# Patient Record
Sex: Female | Born: 1980 | Race: White | Hispanic: No | Marital: Single | Smoking: Current every day smoker
Health system: Southern US, Community
[De-identification: ages and names within clinical notes are randomized; demographics above are authoritative.]

## PROBLEM LIST (undated history)

## (undated) DIAGNOSIS — K649 Unspecified hemorrhoids: Secondary | ICD-10-CM

## (undated) DIAGNOSIS — N6019 Diffuse cystic mastopathy of unspecified breast: Secondary | ICD-10-CM

## (undated) HISTORY — PX: TUBAL LIGATION: SHX77

---

## 2011-09-18 HISTORY — PX: BREAST BIOPSY: SHX20

## 2013-08-27 ENCOUNTER — Emergency Department: Payer: Self-pay | Admitting: Internal Medicine

## 2014-03-02 ENCOUNTER — Emergency Department: Payer: Self-pay | Admitting: Emergency Medicine

## 2014-04-22 ENCOUNTER — Emergency Department: Payer: Self-pay | Admitting: Emergency Medicine

## 2014-05-06 ENCOUNTER — Emergency Department: Payer: Self-pay | Admitting: Emergency Medicine

## 2014-07-27 ENCOUNTER — Emergency Department: Payer: Self-pay | Admitting: Internal Medicine

## 2014-07-29 ENCOUNTER — Emergency Department: Payer: Self-pay | Admitting: Internal Medicine

## 2014-10-11 ENCOUNTER — Emergency Department: Payer: Self-pay | Admitting: Emergency Medicine

## 2015-07-15 ENCOUNTER — Encounter: Payer: Self-pay | Admitting: Emergency Medicine

## 2015-07-15 ENCOUNTER — Emergency Department: Admission: EM | Admit: 2015-07-15 | Discharge: 2015-07-15 | Discharge status: Absent without leave | Payer: Self-pay

## 2015-07-15 ENCOUNTER — Emergency Department
Admission: EM | Admit: 2015-07-15 | Discharge: 2015-07-15 | Disposition: A | Discharge status: Home / Self Care | Payer: Self-pay | Attending: Emergency Medicine | Admitting: Emergency Medicine

## 2015-07-15 DIAGNOSIS — N72 Inflammatory disease of cervix uteri: Secondary | ICD-10-CM

## 2015-07-15 LAB — URINALYSIS COMPLETE WITH MICROSCOPIC (ARMC ONLY)
BILIRUBIN URINE: NEGATIVE
Bacteria, UA: NONE SEEN
GLUCOSE, UA: NEGATIVE mg/dL
HGB URINE DIPSTICK: NEGATIVE
Ketones, ur: NEGATIVE mg/dL
NITRITE: NEGATIVE
Protein, ur: NEGATIVE mg/dL
RBC / HPF: NONE SEEN RBC/hpf (ref 0–5)
SPECIFIC GRAVITY, URINE: 1.008 (ref 1.005–1.030)
pH: 6 (ref 5.0–8.0)

## 2015-07-15 LAB — WET PREP, GENITAL
Trich, Wet Prep: NONE SEEN
Yeast Wet Prep HPF POC: NONE SEEN

## 2015-07-15 LAB — CHLAMYDIA/NGC RT PCR (ARMC ONLY)
CHLAMYDIA TR: DETECTED — AB
N GONORRHOEAE: DETECTED — AB

## 2015-07-15 MED ORDER — LIDOCAINE HCL (PF) 1 % IJ SOLN
2.1000 mL | Freq: Once | INTRAMUSCULAR | Status: AC
  Administered 2015-07-15: 2.1 mL
  Filled 2015-07-15: qty 5

## 2015-07-15 MED ORDER — AZITHROMYCIN 250 MG PO TABS
1000.0000 mg | ORAL_TABLET | Freq: Every day | ORAL | Status: DC
  Administered 2015-07-15: 1000 mg via ORAL
  Filled 2015-07-15: qty 4

## 2015-07-15 MED ORDER — CEFTRIAXONE SODIUM 1 G IJ SOLR
500.0000 mg | Freq: Once | INTRAMUSCULAR | Status: AC
  Administered 2015-07-15: 500 mg via INTRAMUSCULAR
  Filled 2015-07-15: qty 10

## 2015-07-15 MED ORDER — METRONIDAZOLE 500 MG PO TABS: 500.0000 mg | ORAL_TABLET | Freq: Two times a day (BID) | ORAL | Status: DC

## 2015-07-15 NOTE — ED Notes (Signed)
Pt states her boyfriend told her he tested positive for gonorrhea/clamydia.. Pt denies having any sx.

## 2015-07-15 NOTE — Discharge Instructions (Signed)
Cervicitis °Cervicitis is a soreness and swelling (inflammation) of the cervix. Your cervix is located at the bottom of your uterus. It opens up to the vagina. °CAUSES  °· Sexually transmitted infections (STIs).   °· Allergic reaction.   °· Medicines or birth control devices that are put in the vagina.   °· Injury to the cervix.   °· Bacterial infections.   °RISK FACTORS °You are at greater risk if you: °· Have unprotected sexual intercourse. °· Have sexual intercourse with many partners. °· Began sexual intercourse at an early age. °· Have a history of STIs. °SYMPTOMS  °There may be no symptoms. If symptoms occur, they may include:  °· Gray, white, yellow, or bad-smelling vaginal discharge.   °· Pain or itching of the area outside the vagina.   °· Painful sexual intercourse.   °· Lower abdominal or lower back pain, especially during intercourse.   °· Frequent urination.   °· Abnormal vaginal bleeding between periods, after sexual intercourse, or after menopause.   °· Pressure or a heavy feeling in the pelvis.   °DIAGNOSIS  °Diagnosis is made after a pelvic exam. Other tests may include:  °· Examination of any discharge under a microscope (wet prep).   °· A Pap test.   °TREATMENT  °Treatment will depend on the cause of cervicitis. If it is caused by an STI, both you and your partner will need to be treated. Antibiotic medicines will be given.  °HOME CARE INSTRUCTIONS  °· Do not have sexual intercourse until your health care provider says it is okay.   °· Do not have sexual intercourse until your partner has been treated, if your cervicitis is caused by an STI.   °· Take your antibiotics as directed. Finish them even if you start to feel better.   °SEEK MEDICAL CARE IF: °· Your symptoms come back.   °· You have a fever.   °MAKE SURE YOU:  °· Understand these instructions. °· Will watch your condition. °· Will get help right away if you are not doing well or get worse. °  °This information is not intended to replace  advice given to you by your health care provider. Make sure you discuss any questions you have with your health care provider. °  °Document Released: 09/03/2005 Document Revised: 09/08/2013 Document Reviewed: 02/25/2013 °Elsevier Interactive Patient Education ©2016 Elsevier Inc. ° °

## 2015-07-15 NOTE — ED Provider Notes (Signed)
[facility] Emergency Department Provider Note ____________________________________________  Time seen: Approximately 9:31 AM  I have reviewed the triage vital signs and the nursing notes.   HISTORY  Chief Complaint Exposure to STD   HPI Gabriella Lane is a 34 y.o. female who presents to the emergency department for evaluation of STD. She reports that her boyfriend advised her recently and he tested positive for chlamydia and gonorrhea. She denies symptoms with the exception of bleeding after intercourse.   History reviewed. No pertinent past medical history.  There are no active problems to display for this patient.   Past Surgical History  Procedure Laterality Date  . Tubal ligation      Current Outpatient Rx  Lane  Route  Sig  Dispense  Refill  . metroNIDAZOLE (FLAGYL) 500 MG tablet   Oral   Take 1 tablet (500 mg total) by mouth 2 (two) times daily.   14 tablet   0     Allergies Review of patient's allergies indicates no known allergies.  No family history on file.  Social History Social History  Substance Use Topics  . Smoking status: Never Smoker   . Smokeless tobacco: None  . Alcohol Use: No    Review of Systems Constitutional: No fever/chills Cardiovascular: Denies chest pain. Respiratory: Denies shortness of breath or cough. Gastrointestinal: Abdominal pain no., nausea no, vomitingno. Genitourinary: Dysuria no, vaginal discharge no.. Musculoskeletal: Negative for back pain. Skin: Negative for rash. Neurological: Negative for headaches, focal weakness or numbness.  10-point ROS otherwise negative.  ____________________________________________   PHYSICAL EXAM:  VITAL SIGNS: ED Triage Vitals  Enc Vitals Group     BP 07/15/15 0843 127/74 mmHg     Pulse Rate 07/15/15 0843 94     Resp 07/15/15 0843 16     Temp 07/15/15 0843 98.9 F (37.2 C)     Temp src --      SpO2 07/15/15 0843 97 %     Weight 07/15/15 0843 180 lb  (81.647 kg)     Height 07/15/15 0843  (1.575 m)     Head Cir --      Peak Flow --      Pain Score --      Pain Loc --      Pain Edu? --      Excl. in GC? --     Constitutional: Alert and oriented. Well appearing and in no acute distress. Eyes: Conjunctivae are normal. PERRL. EOMI. Head: Atraumatic. Nose: No congestion/rhinnorhea. Mouth/Throat: Mucous membranes are moist.  Oropharynx non-erythematous. Neck: No stridor. Cardiovascular: Good peripheral circulation. Respiratory: Normal respiratory effort.  No retractions. Gastrointestinal: Soft and nontender. No distention. No abdominal bruits. Genitourinary: Pelvic exam: External exam normal, yellow discharge noted on the vaginal walls and at the os of the cervix; mild CMT on bimanual exam Musculoskeletal: No extremity tenderness nor edema.  Neurologic:  Normal speech and language. No gross focal neurologic deficits are appreciated. Speech is normal. No gait instability. Skin:  Skin is warm, dry and intact. No rash noted. Psychiatric: Mood and affect are normal. Speech and behavior are normal.  ____________________________________________   LABS (all labs ordered are listed, but only abnormal results are displayed)  Labs Reviewed  WET PREP, GENITAL - Abnormal; Notable for the following:    Clue Cells Wet Prep HPF POC FEW (*)    WBC, Wet Prep HPF POC MODERATE (*)    All other components within normal limits  URINALYSIS COMPLETEWITH MICROSCOPIC [facility]  ONLY) - Abnormal; Notable for the following:    Color, Urine YELLOW (*)    APPearance CLOUDY (*)    Leukocytes, UA 3+ (*)    Squamous Epithelial / LPF 6-30 (*)    All other components within normal limits  CHLAMYDIA/NGC RT PCR ([facility] ONLY)   ____________________________________________  RADIOLOGY  Not indicated ____________________________________________   PROCEDURES  Procedure(s) performed: None  ____________________________________________   INITIAL  IMPRESSION / ASSESSMENT AND PLAN / ED COURSE  Pertinent labs & imaging results that were available during my care of the patient were reviewed by me and considered in my medical decision making (see chart for details).  Patient was advised to avoid intercourse until partner(s) are tested/treated. Information about the [facility] STD clinic was provided. She was advised to return to the Emergency Department for symptoms that change or worsen if unable to schedule an appointment.  ____________________________________________   FINAL CLINICAL IMPRESSION(S) / ED DIAGNOSES  Final diagnoses:  Cervicitis       Chinita PesterCari B Emiel Kielty, FNP 07/15/15 1044  Phineas SemenGraydon Goodman, MD 07/15/15 1147

## 2015-07-19 ENCOUNTER — Telehealth: Payer: Self-pay | Admitting: Emergency Medicine

## 2015-07-19 NOTE — ED Notes (Signed)
Called pt and informed her of positive gonorrhea and chlamydia tests. pateint was treated while in the ED.

## 2015-11-09 DIAGNOSIS — N644 Mastodynia: Secondary | ICD-10-CM | POA: Insufficient documentation

## 2015-11-26 ENCOUNTER — Emergency Department
Admission: EM | Admit: 2015-11-26 | Discharge: 2015-11-26 | Disposition: A | Discharge status: Home / Self Care | Payer: Self-pay | Attending: Emergency Medicine | Admitting: Emergency Medicine

## 2015-11-26 ENCOUNTER — Encounter: Payer: Self-pay | Admitting: Emergency Medicine

## 2015-11-26 DIAGNOSIS — F17210 Nicotine dependence, cigarettes, uncomplicated: Secondary | ICD-10-CM | POA: Insufficient documentation

## 2015-11-26 DIAGNOSIS — H6982 Other specified disorders of Eustachian tube, left ear: Secondary | ICD-10-CM | POA: Insufficient documentation

## 2015-11-26 MED ORDER — PREDNISONE 10 MG PO TABS: ORAL_TABLET | ORAL | Status: DC

## 2015-11-26 MED ORDER — FLUTICASONE PROPIONATE 50 MCG/ACT NA SUSP: 2.0000 | Freq: Every day | NASAL | Status: DC

## 2015-11-26 NOTE — ED Provider Notes (Signed)
[facility] Emergency Department Provider Note  ____________________________________________  Time seen: Approximately 6:49 PM  I have reviewed the triage vital signs and the nursing notes.   HISTORY  Chief Complaint Otalgia   HPI Gabriella Lane is a 35 y.o. female is here complaining of left ear pain for 3 days. Patient is unaware of any fever or chills. She states that she is not taking any over-the-counter medication for her ear. She states there is a lot of pressure and "feels like water in her ear". She rates her pain as a 7 out of 10.She also has had some mild nasal congestion and is not taking any over-the-counter medication for it as well.   History reviewed. No pertinent past medical history.  There are no active problems to display for this patient.   Past Surgical History  Procedure Laterality Date  . Tubal ligation      Current Outpatient Rx  Name  Route  Sig  Dispense  Refill  . fluticasone (FLONASE) 50 MCG/ACT nasal spray   Each Nare   Place 2 sprays into both nostrils daily.   16 g   0   . metroNIDAZOLE (FLAGYL) 500 MG tablet   Oral   Take 1 tablet (500 mg total) by mouth 2 (two) times daily.   14 tablet   0   . predniSONE (DELTASONE) 10 MG tablet      Take 3 tablets once a day for 3 days   9 tablet   0     Allergies Review of patient's allergies indicates no known allergies.  History reviewed. No pertinent family history.  Social History Social History  Substance Use Topics  . Smoking status: Current Every Day Smoker -- 1.00 packs/day    Types: Cigarettes  . Smokeless tobacco: Never Used  . Alcohol Use: No    Review of Systems Constitutional: No fever/chills ENT: No sore throat. Positive left ear pain. Cardiovascular: Denies chest pain. Respiratory: Denies shortness of breath. Genitourinary: Negative for dysuria. Musculoskeletal: Negative for back pain. Skin: Negative for rash. Neurological: Negative for  headaches, focal weakness or numbness.  10-point ROS otherwise negative.  ____________________________________________   PHYSICAL EXAM:  VITAL SIGNS: ED Triage Vitals  Enc Vitals Group     BP 11/26/15 1731 125/80 mmHg     Pulse Rate 11/26/15 1731 81     Resp 11/26/15 1731 18     Temp 11/26/15 1731 98.6 F (37 C)     Temp Source 11/26/15 1731 Oral     SpO2 11/26/15 1731 98 %     Weight 11/26/15 1731 160 lb (72.576 kg)     Height 11/26/15 1731  (1.575 m)     Head Cir --      Peak Flow --      Pain Score 11/26/15 1731 7     Pain Loc --      Pain Edu? --      Excl. in GC? --     Constitutional: Alert and oriented. Well appearing and in no acute distress. Eyes: Conjunctivae are normal. PERRL. EOMI. Head: Atraumatic. Nose: Mild congestion/rhinnorhea. EACs are clear bilaterally. The left TM is dull with poor light reflex but no injection was noted. There appears to be some mild fluid present. Right TM is dull without injection. Mouth/Throat: Mucous membranes are moist.  Oropharynx non-erythematous. Neck: No stridor.   Hematological/Lymphatic/Immunilogical: No cervical lymphadenopathy. Cardiovascular: [facility].  Good peripheral circulation. Respiratory:  Normal respiratory effort.  No retractions. Lungs CTAB. Musculoskeletal: Upper and lower extremities without any difficulty and normal gait was noted. Neurologic:  Normal speech and language. No gross focal neurologic deficits are appreciated. No gait instability. Skin:  Skin is warm, dry and intact. No rash noted. Psychiatric: Mood and affect are normal. Speech and behavior are normal.  ____________________________________________   LABS (all labs ordered are listed, but only abnormal results are displayed)  Labs Reviewed - No data to display  PROCEDURES  Procedure(s) performed: None  Critical Care performed:  No  ____________________________________________   INITIAL IMPRESSION / ASSESSMENT AND PLAN / ED COURSE  Pertinent labs & imaging results that were available during my care of the patient were reviewed by me and considered in my medical decision making (see chart for details).  She was started on Flonase nasal spray along with prednisone. She is to follow-up with [facility] if any continued problems. ____________________________________________   FINAL CLINICAL IMPRESSION(S) / ED DIAGNOSES  Final diagnoses:  Eustachian tube dysfunction, left      Tommi RumpsRhonda L Summers, PA-C 11/26/15 2258  Sharyn CreamerMark Quale, MD 11/26/15 2359

## 2015-11-26 NOTE — ED Notes (Signed)
Pt c/o L ear pain x 3 days. Denies other symptoms at this time.

## 2015-12-02 ENCOUNTER — Emergency Department
Admission: EM | Admit: 2015-12-02 | Discharge: 2015-12-02 | Disposition: A | Discharge status: Home / Self Care | Payer: Self-pay | Attending: Emergency Medicine | Admitting: Emergency Medicine

## 2015-12-02 ENCOUNTER — Encounter: Payer: Self-pay | Admitting: Emergency Medicine

## 2015-12-02 DIAGNOSIS — F17210 Nicotine dependence, cigarettes, uncomplicated: Secondary | ICD-10-CM | POA: Insufficient documentation

## 2015-12-02 DIAGNOSIS — B309 Viral conjunctivitis, unspecified: Secondary | ICD-10-CM | POA: Insufficient documentation

## 2015-12-02 MED ORDER — FLUORESCEIN SODIUM 1 MG OP STRP
1.0000 | ORAL_STRIP | Freq: Once | OPHTHALMIC | Status: DC
  Filled 2015-12-02: qty 1

## 2015-12-02 MED ORDER — KETOROLAC TROMETHAMINE 0.5 % OP SOLN: 1.0000 [drp] | Freq: Four times a day (QID) | OPHTHALMIC | Status: DC

## 2015-12-02 NOTE — ED Provider Notes (Signed)
[facility] Emergency Department Provider Note ____________________________________________  Time seen: 1935  I have reviewed the triage vital signs and the nursing notes.  HISTORY  Chief Complaint  Eye Problem  HPI Gabriella Lane is a 35 y.o. female density ED for right eyeredness and drainage the last 3 days. Patient describes onset Wednesday of right eye tearing with some mild purulent drainage. She darted on a friend's gentamicin ophthalmic ointment on the day of onset, but denies any significant improvement in her symptoms at this time. She describes on Wednesday she woke up and had this sensation that "something was in my eye". She denies interim nausea, vomiting, or dizziness. She does not wear glasses or contacts. She describes her overall discomfort 8/10 in triage.  History reviewed. No pertinent past medical history.  There are no active problems to display for this patient.  Past Surgical History  Procedure Laterality Date  . Tubal ligation      Current Outpatient Rx  Name  Route  Sig  Dispense  Refill  . fluticasone (FLONASE) 50 MCG/ACT nasal spray   Each Nare   Place 2 sprays into both nostrils daily.   16 g   0   . ketorolac (ACULAR) 0.5 % ophthalmic solution   Right Eye   Place 1 drop into the right eye 4 (four) times daily.   5 mL   0   . metroNIDAZOLE (FLAGYL) 500 MG tablet   Oral   Take 1 tablet (500 mg total) by mouth 2 (two) times daily.   14 tablet   0   . predniSONE (DELTASONE) 10 MG tablet      Take 3 tablets once a day for 3 days   9 tablet   0    Allergies Review of patient's allergies indicates no known allergies.  No family history on file.  Social History Social History  Substance Use Topics  . Smoking status: Current Every Day Smoker -- 1.00 packs/day    Types: Cigarettes  . Smokeless tobacco: Never Used  . Alcohol Use: No   Review of Systems  Constitutional: Negative for fever. Eyes: Negative for  visual changes.Right eye irritation and redness as above. ENT: Negative for sore throat. Respiratory: Negative for shortness of breath. Gastrointestinal: Negative for abdominal pain, vomiting and diarrhea. Musculoskeletal: Negative for back pain. Skin: Negative for rash. Neurological: Negative for headaches, focal weakness or numbness. ____________________________________________  PHYSICAL EXAM:  VITAL SIGNS: ED Triage Vitals  Enc Vitals Group     BP 12/02/15 1745 109/84 mmHg     Pulse Rate 12/02/15 1745 76     Resp 12/02/15 1745 16     Temp 12/02/15 1745 99.4 F (37.4 C)     Temp Source 12/02/15 1745 Oral     SpO2 12/02/15 1745 98 %     Weight 12/02/15 1745 170 lb (77.111 kg)     Height 12/02/15 1745  (1.575 m)     Head Cir --      Peak Flow --      Pain Score 12/02/15 1747 8     Pain Loc --      Pain Edu? --      Excl. in GC? --    Constitutional: Alert and oriented. Well appearing and in no distress. Head: Normocephalic and atraumatic.      Eyes: Conjunctivae are Injected on the right. Mild upper lid blepharitis on the right. PERRL. Normal extraocular movements. No fluorescein dye uptake on the right.  No purulent drainage is noted.      Ears: Canals clear. TMs intact bilaterally.   Nose: No congestion/rhinorrhea.   Mouth/Throat: Mucous membranes are moist.   Neck: Supple. No thyromegaly. Hematological/Lymphatic/Immunological: No cervical or preauricular lymphadenopathy. Musculoskeletal: Nontender with normal range of motion in all extremities.  Neurologic:  Normal gait without ataxia. Normal speech and language. No gross focal neurologic deficits are appreciated. Skin:  Skin is warm, dry and intact. No rash noted. ____________________________________________  INITIAL IMPRESSION / ASSESSMENT AND PLAN / ED COURSE  Patient was appears to be an acute viral conjunctivae is the right eye. She is discharged with a prescription for Acular for eye irritation. She  is encouraged to utilize over-the-counter rewetting drops artificial tears as needed. She is advised that the gentamicin would not have the benefit or an effect on this particular infection. She will follow-up with her primary care provider or [facility] as needed for symptom management. ____________________________________________  FINAL CLINICAL IMPRESSION(S) / ED DIAGNOSES  Final diagnoses:  Acute viral conjunctivitis of right eye      Lissa HoardJenise V Bacon Malaiyah Achorn, PA-C 12/03/15 0056  Myrna Blazeravid Matthew Schaevitz, MD 12/04/15 [phone]

## 2015-12-02 NOTE — Discharge Instructions (Signed)
Viral Conjunctivitis Viral conjunctivitis is an inflammation of the clear membrane that covers the white part of your eye and the inner surface of your eyelid (conjunctiva). The inflammation is caused by a viral infection. The blood vessels in the conjunctiva become inflamed, causing the eye to become red or pink, and often itchy. Viral conjunctivitis can easily be passed from one person to another (contagious). CAUSES  Viral conjunctivitis is caused by a virus. A virus is a type of contagious germ. It can be spread by touching objects that have been contaminated with the virus, such as doorknobs or towels.  SYMPTOMS  Symptoms of viral conjunctivitis may include:   Eye redness.  Tearing or watery eyes.  Itchy eyes.  Burning feeling in the eyes.  Clear drainage from the eye.  Swollen eyelids.  A gritty feeling in the eye.  Light sensitivity. DIAGNOSIS  Viral conjunctivitis may be diagnosed with a medical history and physical exam. If you have discharge from your eye, the discharge may be tested to rule out other causes of conjunctivitis.  TREATMENT  Viral conjunctivitis does not respond to medicines that kill bacteria (antibiotics). Treatment for viral conjunctivitis is directed at stopping a bacterial infection from developing in addition to the viral infection. Treatment also aims to relieve your symptoms, such as itching. This may be done with antihistamine drops or other eye medicines. HOME CARE INSTRUCTIONS  Take medicines only as directed by your health care provider.  Avoid touching or rubbing your eyes.  Apply a warm, clean washcloth to your eye for 10-20 minutes, 3-4 times per day.  If you wear contact lenses, do not wear them until the inflammation is gone and your health care provider says it is safe to wear them again. Ask your health care provider how to sterilize or replace your contact lenses before using them again. Wear glasses until you can resume wearing  contacts.  Avoid wearing eye makeup until the inflammation is gone. Throw away any old eye cosmetics that may be contaminated.  Change or wash your pillowcase every day.  Do not share towels or washcloths. This may spread the infection.  Wash your hands often with soap and water. Use paper towels to dry your hands.  Gently wipe away any drainage from your eye with a warm, wet washcloth or a cotton ball.  Be very careful to avoid touching the edge of the eyelid with the eye drop bottle or ointment tube when applying medicines to the affected eye. This will stop you from spreading the infection to the other eye or to other people. SEEK MEDICAL CARE IF:   Your symptoms do not improve with treatment.  You have increased pain.  Your vision becomes blurry.  You have a fever.  You have facial pain, redness, or swelling.  You have new symptoms.  Your symptoms get worse.   This information is not intended to replace advice given to you by your health care provider. Make sure you discuss any questions you have with your health care provider.   Document Released: 11/24/2002 Document Revised: 02/25/2006 Document Reviewed: 06/15/2014 Elsevier Interactive Patient Education 2016 [employer]  Use the eye drops as directed. Follow-up with Dr. Druscilla BrowniePorfilio as needed.

## 2015-12-02 NOTE — ED Notes (Signed)
Patient presents to the ED with right eye pain and redness since Wednesday.  Patient reports waking up and feeling like, "something was in my eye".  Patient reports eye discharge as well.  Patient is in no obvious distress at this time.

## 2016-08-09 ENCOUNTER — Emergency Department
Admission: EM | Admit: 2016-08-09 | Discharge: 2016-08-09 | Disposition: A | Discharge status: Home / Self Care | Payer: Self-pay | Attending: Emergency Medicine | Admitting: Emergency Medicine

## 2016-08-09 DIAGNOSIS — F17210 Nicotine dependence, cigarettes, uncomplicated: Secondary | ICD-10-CM | POA: Insufficient documentation

## 2016-08-09 DIAGNOSIS — J069 Acute upper respiratory infection, unspecified: Secondary | ICD-10-CM | POA: Insufficient documentation

## 2016-08-09 MED ORDER — BENZONATATE 100 MG PO CAPS: 100.0000 mg | ORAL_CAPSULE | Freq: Three times a day (TID) | ORAL | 0 refills | Status: DC | PRN

## 2016-08-09 NOTE — Discharge Instructions (Signed)
Your symptoms are likely due to a viral illness. You should continue to monitor and treat symptoms. Consider taking OTC Delsym cough syrup and allergy medicine + pseudoephedrine (Allegra-D/Claritin-D/Zyrtec-D) for symptom relief. [facility].

## 2016-08-09 NOTE — ED Triage Notes (Signed)
Pt came to ED via pov c/o sore throat and cough starting yesterday. Denies fever, chills.

## 2016-08-09 NOTE — ED Provider Notes (Signed)
[facility] Emergency Department Provider Note ____________________________________________  Time seen: [phone]  I have reviewed the triage vital signs and the nursing notes.  HISTORY  Chief Complaint  Sore Throat  HPI Gabriella Lane is a 35 y.o. female presents to the ED for evaluation of sore throat with onset about 8 hours prior. She denies fevers, but reports "hot & cold flashes." She also describes sore throat is fullness to the throat, but denies any difficulty swallowing, breathing, or controlling oral secretions. She is not taking any medications to alleviate her symptoms. In addition to the sore throat she reports some postnasal drainage, positive cough, and sinus pressure.  History reviewed. No pertinent past medical history.  There are no active problems to display for this patient.  Past Surgical History:  Procedure Laterality Date  . TUBAL LIGATION      Prior to Admission medications   Medication Sig Start Date End Date Taking? Authorizing Provider  benzonatate (TESSALON PERLES) 100 MG capsule Take 1 capsule (100 mg total) by mouth 3 (three) times daily as needed for cough (Take 1-2 per dose). 08/09/16   Chaston Bradburn V Bacon Darilyn Storbeck, PA-C  fluticasone (FLONASE) 50 MCG/ACT nasal spray Place 2 sprays into both nostrils daily. 11/26/15 11/25/16  Tommi Rumpshonda L Summers, PA-C  ketorolac (ACULAR) 0.5 % ophthalmic solution Place 1 drop into the right eye 4 (four) times daily. 12/02/15   Berlin Mokry V Bacon Orrin Yurkovich, PA-C  metroNIDAZOLE (FLAGYL) 500 MG tablet Take 1 tablet (500 mg total) by mouth 2 (two) times daily. 07/15/15   Chinita Pesterari B Triplett, FNP  predniSONE (DELTASONE) 10 MG tablet Take 3 tablets once a day for 3 days 11/26/15   Tommi Rumpshonda L Summers, PA-C    Allergies Patient has no known allergies.  No family history on file.  Social History Social History  Substance Use Topics  . Smoking status: Current Every Day Smoker    Packs/day: 1.00    Types: Cigarettes  .  Smokeless tobacco: Never Used  . Alcohol use No   Review of Systems  Constitutional: Negative for fever. Eyes: Negative for visual changes. ENT: Positive for sore throat. Cardiovascular: Negative for chest pain. Respiratory: Negative for shortness of breath. Reports productive cough. Gastrointestinal: Negative for abdominal pain, vomiting and diarrhea. Neurological: Negative for headaches, focal weakness or numbness. ____________________________________________  PHYSICAL EXAM:  VITAL SIGNS: ED Triage Vitals [08/09/16 0940]  Enc Vitals Group     BP 133/81     Pulse Rate 80     Resp 16     Temp 98.4 F (36.9 C)     Temp Source Oral     SpO2 100 %     Weight 170 lb (77.1 kg)     Height 5\' 2"  (1.575 m)     Head Circumference      Peak Flow      Pain Score      Pain Loc      Pain Edu?      Excl. in GC?    Constitutional: Alert and oriented. Well appearing and in no distress. Head: Normocephalic and atraumatic. Eyes: Conjunctivae are normal. PERRL. Normal extraocular movements Ears: Canals clear. TMs intact bilaterally. TMs are slightly retracted mildly erythematous and with early serous effusion appreciated. Nose: No congestion/rhinorrhea/epistaxis. Mouth/Throat: Mucous membranes are moist. Uvula is midline and tonsils are large but without edema, erythema, or exudate. Neck: Supple. No thyromegaly. Hematological/Lymphatic/Immunological: No cervical lymphadenopathy. Cardiovascular: Normal rate, regular rhythm. Normal distal pulses. Respiratory: Normal respiratory effort. No  wheezes/rales/rhonchi. Neurologic:  Normal gait without ataxia. Normal speech and language. No gross focal neurologic deficits are appreciated. Skin:  Skin is warm, dry and intact. No rash noted. Psychiatric: Mood and affect are normal. Patient exhibits appropriate insight and judgment. ____________________________________________   LABS (pertinent positives/negatives) Labs Reviewed  CULTURE, GROUP A  STREP [facility]  Rapid Strep - NEGATIVE ____________________________________________  INITIAL IMPRESSION / ASSESSMENT AND PLAN / ED COURSE  Patient with symptoms of the sore throat likely viral in nature. She will be discharged with prescription [facility]. She is encouraged to dose over-the-counter Delsym cough syrup as well as a combination allergy medicine/decongestant. [facility].  Clinical Course    ____________________________________________  FINAL CLINICAL IMPRESSION(S) / ED DIAGNOSES  Final diagnoses:  Viral upper respiratory tract infection      Lissa HoardJenise V Bacon Kyndall Chaplin, PA-C 08/09/16 1020    Nita Sicklearolina Veronese, MD 08/12/16 2326

## 2016-08-11 LAB — CULTURE, GROUP A STREP (THRC)

## 2016-08-12 ENCOUNTER — Encounter: Payer: Self-pay | Admitting: Medical Oncology

## 2016-08-12 ENCOUNTER — Emergency Department
Admission: EM | Admit: 2016-08-12 | Discharge: 2016-08-12 | Disposition: A | Discharge status: Home / Self Care | Payer: Self-pay | Attending: Student in an Organized Health Care Education/Training Program | Admitting: Student in an Organized Health Care Education/Training Program

## 2016-08-12 ENCOUNTER — Emergency Department: Payer: Self-pay

## 2016-08-12 DIAGNOSIS — F17200 Nicotine dependence, unspecified, uncomplicated: Secondary | ICD-10-CM

## 2016-08-12 DIAGNOSIS — J209 Acute bronchitis, unspecified: Secondary | ICD-10-CM | POA: Insufficient documentation

## 2016-08-12 DIAGNOSIS — F17210 Nicotine dependence, cigarettes, uncomplicated: Secondary | ICD-10-CM | POA: Insufficient documentation

## 2016-08-12 LAB — CBC WITH DIFFERENTIAL/PLATELET
BASOS PCT: 1 %
Basophils Absolute: 0.1 10*3/uL (ref 0–0.1)
Eosinophils Absolute: 0.3 10*3/uL (ref 0–0.7)
Eosinophils Relative: 2 %
HEMATOCRIT: 36.7 % (ref 35.0–47.0)
HEMOGLOBIN: 12.6 g/dL (ref 12.0–16.0)
LYMPHS PCT: 12 %
Lymphs Abs: 2.1 10*3/uL (ref 1.0–3.6)
MCH: 27.3 pg (ref 26.0–34.0)
MCHC: 34.2 g/dL (ref 32.0–36.0)
MCV: 79.8 fL — AB (ref 80.0–100.0)
MONO ABS: 0.9 10*3/uL (ref 0.2–0.9)
MONOS PCT: 5 %
NEUTROS ABS: 13.7 10*3/uL — AB (ref 1.4–6.5)
NEUTROS PCT: 80 %
Platelets: 227 10*3/uL (ref 150–440)
RBC: 4.6 MIL/uL (ref 3.80–5.20)
RDW: 13.9 % (ref 11.5–14.5)
WBC: 17.1 10*3/uL — ABNORMAL HIGH (ref 3.6–11.0)

## 2016-08-12 LAB — URINALYSIS COMPLETE WITH MICROSCOPIC (ARMC ONLY)
BILIRUBIN URINE: NEGATIVE
GLUCOSE, UA: NEGATIVE mg/dL
HGB URINE DIPSTICK: NEGATIVE
KETONES UR: NEGATIVE mg/dL
NITRITE: NEGATIVE
Protein, ur: NEGATIVE mg/dL
SPECIFIC GRAVITY, URINE: 1.018 (ref 1.005–1.030)
pH: 5 (ref 5.0–8.0)

## 2016-08-12 LAB — INFLUENZA PANEL BY PCR (TYPE A & B)
INFLAPCR: NEGATIVE
Influenza B By PCR: NEGATIVE

## 2016-08-12 LAB — POCT PREGNANCY, URINE: Preg Test, Ur: NEGATIVE

## 2016-08-12 MED ORDER — ALBUTEROL SULFATE HFA 108 (90 BASE) MCG/ACT IN AERS: 2.0000 | INHALATION_SPRAY | Freq: Four times a day (QID) | RESPIRATORY_TRACT | 2 refills | Status: DC | PRN

## 2016-08-12 MED ORDER — GUAIFENESIN-CODEINE 100-10 MG/5ML PO SOLN: 5.0000 mL | ORAL | 0 refills | Status: DC | PRN

## 2016-08-12 MED ORDER — SULFAMETHOXAZOLE-TRIMETHOPRIM 800-160 MG PO TABS: 1.0000 | ORAL_TABLET | Freq: Two times a day (BID) | ORAL | 0 refills | Status: DC

## 2016-08-12 NOTE — Discharge Instructions (Signed)
Follow-up with your primary care doctor if any continued problems or [facility]. Discontinue smoking. Pro-air albuterol inhaler as directed. Bactrim DS twice a day for infection. Increase fluids. Tylenol or ibuprofen as needed for muscle aches, fever or headache.

## 2016-08-12 NOTE — ED Provider Notes (Signed)
[facility] Emergency Department Provider Note   ____________________________________________   First MD Initiated Contact with Patient 08/12/16 1035     (approximate)  I have reviewed the triage vital signs and the nursing notes.   HISTORY  Chief Complaint URI; Chills; and Generalized Body Aches    HPI Gabriella Lane is a 35 y.o. female is here with complaint of cold symptoms and continues to have body aches which has worsened. Patient states she's also had intermittent fever and chills. She has not had any relief of her cough and states that mostly her cough is nonproductive. Patient was seen in the emergency room on 08/09/16 which time she was diagnosed with upper respiratory infection. Patient was given a prescription for [facility] which she states has not helped with her cough. She states that she has been "exposed to the flu". She states she is feeling worse than she did 3 days ago.   History reviewed. No pertinent past medical history.  There are no active problems to display for this patient.   Past Surgical History:  Procedure Laterality Date  . TUBAL LIGATION      Prior to Admission medications   Medication Sig Start Date End Date Taking? Authorizing Provider  albuterol (PROVENTIL HFA;VENTOLIN HFA) 108 (90 Base) MCG/ACT inhaler Inhale 2 puffs into the lungs every 6 (six) hours as needed for wheezing or shortness of breath. 08/12/16   Tommi Rumpshonda L Summers, PA-C  benzonatate (TESSALON PERLES) 100 MG capsule Take 1 capsule (100 mg total) by mouth 3 (three) times daily as needed for cough (Take 1-2 per dose). 08/09/16   Jenise V Bacon Menshew, PA-C  fluticasone (FLONASE) 50 MCG/ACT nasal spray Place 2 sprays into both nostrils daily. 11/26/15 11/25/16  Tommi Rumpshonda L Summers, PA-C  guaiFENesin-codeine 100-10 MG/5ML syrup Take 5 mLs by mouth every 4 (four) hours as needed. 08/12/16   Tommi Rumpshonda L Summers, PA-C  sulfamethoxazole-trimethoprim (BACTRIM  DS,SEPTRA DS) 800-160 MG tablet Take 1 tablet by mouth 2 (two) times daily. 08/12/16   Tommi Rumpshonda L Summers, PA-C    Allergies Patient has no known allergies.  No family history on file.  Social History Social History  Substance Use Topics  . Smoking status: Current Every Day Smoker    Packs/day: 1.00    Types: Cigarettes  . Smokeless tobacco: Never Used  . Alcohol use No    Review of Systems Constitutional: No fever/chills Eyes: No visual changes. ENT: Positive sore throat. Cardiovascular: Denies chest pain. Respiratory: Denies shortness of breath. Positive for cough. Gastrointestinal: No abdominal pain.  No nausea, no vomiting.  No diarrhea.  No constipation. Genitourinary: Negative for dysuria. Musculoskeletal: Negative for back pain. Positive for generalized body aches. Skin: Negative for rash. Neurological: Negative for headaches, focal weakness or numbness.  10-point ROS otherwise negative.  ____________________________________________   PHYSICAL EXAM:  VITAL SIGNS: ED Triage Vitals [08/12/16 1016]  Enc Vitals Group     BP 122/72     Pulse Rate 100     Resp 19     Temp 98.8 F (37.1 C)     Temp Source Oral     SpO2 97 %     Weight 170 lb (77.1 kg)     Height 5\' 3"  (1.6 m)     Head Circumference      Peak Flow      Pain Score 7     Pain Loc      Pain Edu?      Excl.  in GC?     Constitutional: Alert and oriented. Well appearing and in no acute distress. Eyes: Conjunctivae are normal. PERRL. EOMI. Head: Atraumatic. Nose: Mild congestion/no rhinnorhea.  EACs are clear bilaterally. TMs are dull, no erythema or injection was seen. Mouth/Throat: Mucous membranes are moist.  Oropharynx non-erythematous. Mild posterior drainage. Neck: No stridor.   Hematological/Lymphatic/Immunilogical: No cervical lymphadenopathy. Cardiovascular: Normal rate, regular rhythm. Grossly normal heart sounds.  Good peripheral circulation. Respiratory: Normal respiratory effort.   No retractions. Lungs CTAB. Gastrointestinal: Soft and nontender. No distention.  Musculoskeletal: No lower extremity tenderness nor edema.  No joint effusions. Neurologic:  Normal speech and language. No gross focal neurologic deficits are appreciated. No gait instability. Skin:  Skin is warm, dry and intact. No rash noted. Psychiatric: Mood and affect are normal. Speech and behavior are normal.  ____________________________________________   LABS (all labs ordered are listed, but only abnormal results are displayed)  Labs Reviewed  URINALYSIS COMPLETEWITH MICROSCOPIC ([facility] ONLY) - Abnormal; Notable for the following:       Result Value   Color, Urine YELLOW (*)    APPearance CLEAR (*)    Leukocytes, UA TRACE (*)    Bacteria, UA RARE (*)    Squamous Epithelial / LPF 0-5 (*)    All other components within normal limits  CBC WITH DIFFERENTIAL/PLATELET - Abnormal; Notable for the following:    WBC 17.1 (*)    MCV 79.8 (*)    Neutro Abs 13.7 (*)    All other components within normal limits  INFLUENZA PANEL BY PCR (TYPE A & B, H1N1)  POC URINE PREG, ED  POCT PREGNANCY, URINE    RADIOLOGY Chest x-ray per radiologist: IMPRESSION:  No infiltrate or pulmonary edema. Perihilar infrahilar bronchitic  changes.   I, Tommi Rumpshonda L Summers, personally viewed and evaluated these images (plain radiographs) as part of my medical decision making, as well as reviewing the written report by the radiologist.  ____________________________________________   PROCEDURES  Procedure(s) performed: None  Procedures  Critical Care performed: No  ____________________________________________   INITIAL IMPRESSION / ASSESSMENT AND PLAN / ED COURSE  Pertinent labs & imaging results that were available during my care of the patient were reviewed by me and considered in my medical decision making (see chart for details).    Clinical Course    Patient was given medication for treatment of  bronchitis. Patient was given a prescription for Bactrim DS twice a day for 10 days along with albuterol inhaler. She is given a prescription for Robitussin-AC as needed for cough. She is aware that she cannot drive or operate machinery while taking this medication. She will follow-up with her primary care doctor or [facility] if any continued problems.  ____________________________________________   FINAL CLINICAL IMPRESSION(S) / ED DIAGNOSES  Final diagnoses:  Acute bronchitis, unspecified organism  Current every day smoker      NEW MEDICATIONS STARTED DURING THIS VISIT:  Discharge Medication List as of 08/12/2016 12:35 PM    START taking these medications   Details  albuterol (PROVENTIL HFA;VENTOLIN HFA) 108 (90 Base) MCG/ACT inhaler Inhale 2 puffs into the lungs every 6 (six) hours as needed for wheezing or shortness of breath., Starting Sun 08/12/2016, Print    sulfamethoxazole-trimethoprim (BACTRIM DS,SEPTRA DS) 800-160 MG tablet Take 1 tablet by mouth 2 (two) times daily., Starting Sun 08/12/2016, Print         Note:  This document was prepared using Dragon voice recognition software and may include unintentional dictation errors.  Tommi Rumps, PA-C 08/12/16 1847    Willy Eddy, MD 08/13/16 [phone]

## 2016-08-12 NOTE — ED Triage Notes (Signed)
Pt began having cold sx's Thursday, yesterday began having body aches.

## 2016-08-12 NOTE — ED Notes (Signed)
See triage note  States she was seen on Thursday   And dx'd with URI    conts to have body aches with intermittent fever   States no relief from cough   States cough is mainly dry but occasionally prod

## 2017-06-25 ENCOUNTER — Emergency Department
Admission: EM | Admit: 2017-06-25 | Discharge: 2017-06-25 | Disposition: A | Discharge status: Home / Self Care | Payer: Self-pay | Attending: Emergency Medicine | Admitting: Emergency Medicine

## 2017-06-25 ENCOUNTER — Encounter: Payer: Self-pay | Admitting: Medical Oncology

## 2017-06-25 DIAGNOSIS — F17210 Nicotine dependence, cigarettes, uncomplicated: Secondary | ICD-10-CM | POA: Insufficient documentation

## 2017-06-25 DIAGNOSIS — H6981 Other specified disorders of Eustachian tube, right ear: Secondary | ICD-10-CM | POA: Insufficient documentation

## 2017-06-25 MED ORDER — FLUTICASONE PROPIONATE 50 MCG/ACT NA SUSP: 2.0000 | Freq: Every day | NASAL | 0 refills | Status: DC

## 2017-06-25 MED ORDER — PREDNISONE 10 MG PO TABS: ORAL_TABLET | ORAL | 0 refills | Status: DC

## 2017-06-25 NOTE — ED Provider Notes (Signed)
[facility] Emergency Department Provider Note   ____________________________________________   First MD Initiated Contact with Patient 06/25/17 0732     (approximate)  I have reviewed the triage vital signs and the nursing notes.   HISTORY  Chief Complaint Otalgia    HPI Gabriella Lane is a 36 y.o. female is here complaining of right ear pain for approximate 5 days. Patient is unaware of any fever and denies chills.Patient states that the outside of her ear has felt warm at times. She is tried over-the-counter medications without any relief. She also complains of decreased hearing in her right ear with some dizziness. Currently she rates her pain as 5/10.  History reviewed. No pertinent past medical history.  There are no active problems to display for this patient.   Past Surgical History:  Procedure Laterality Date  . TUBAL LIGATION      Prior to Admission medications   Medication Sig Start Date End Date Taking? Authorizing Provider  albuterol (PROVENTIL HFA;VENTOLIN HFA) 108 (90 Base) MCG/ACT inhaler Inhale 2 puffs into the lungs every 6 (six) hours as needed for wheezing or shortness of breath. 08/12/16   Tommi Rumps, PA-C  fluticasone (FLONASE) 50 MCG/ACT nasal spray Place 2 sprays into both nostrils daily. 06/25/17 06/25/18  Tommi Rumps, PA-C  predniSONE (DELTASONE) 10 MG tablet Take 3 tablets once a day for the next 4 days. 06/25/17   Tommi Rumps, PA-C    Allergies Patient has no known allergies.  No family history on file.  Social History Social History  Substance Use Topics  . Smoking status: Current Every Day Smoker    Packs/day: 1.00    Types: Cigarettes  . Smokeless tobacco: Never Used  . Alcohol use No    Review of Systems Constitutional: No fever/chills Eyes: No visual changes. ENT: No sore throat.  Positive right ear pain. Cardiovascular: Denies chest pain. Respiratory: Denies shortness of  breath. Gastrointestinal:   No nausea, no vomiting.  Musculoskeletal: Negative for back pain. Skin: Negative for rash. Neurological: Negative for headaches, focal weakness or numbness. ___________________________________________   PHYSICAL EXAM:  VITAL SIGNS: ED Triage Vitals [06/25/17 0716]  Enc Vitals Group     BP 136/82     Pulse Rate (!) 108     Resp 16     Temp 98.4 F (36.9 C)     Temp Source Oral     SpO2 98 %     Weight 170 lb (77.1 kg)     Height      Head Circumference      Peak Flow      Pain Score 5     Pain Loc      Pain Edu?      Excl. in GC?    Constitutional: Alert and oriented. Well appearing and in no acute distress. Eyes: Conjunctivae are normal.  Head: Atraumatic. Nose: Minimal congestion/rhinnorhea.  EACs bilaterally are clear. Right TM is dull with poor light reflex but no injection or erythema. Mouth/Throat: Mucous membranes are moist.  Oropharynx non-erythematous. Moderate posterior drainage. Neck: No stridor.   Hematological/Lymphatic/Immunilogical: No cervical lymphadenopathy. Cardiovascular: Normal rate, regular rhythm. Grossly normal heart sounds.  Good peripheral circulation. Respiratory: Normal respiratory effort.  No retractions. Lungs CTAB. Musculoskeletal: No lower extremity tenderness nor edema.  No joint effusions. Neurologic:  Normal speech and language. No gross focal neurologic deficits are appreciated. No gait instability. Skin:  Skin is warm, dry and intact. No rash noted. Psychiatric:  Mood and affect are normal. Speech and behavior are normal.  ____________________________________________   LABS (all labs ordered are listed, but only abnormal results are displayed)  Labs Reviewed - No data to display  PROCEDURES  Procedure(s) performed: None  Procedures  Critical Care performed: No  ____________________________________________   INITIAL IMPRESSION / ASSESSMENT AND PLAN / ED COURSE  Patient was placed on Flonase  nasal spray 2 sprays each nostril daily and prednisone 30 mg daily for the next 4 days. She is to follow-up with [facility] if any continued problems. Patient was reassured that there is no signs of infection at this time.   ___________________________________________   FINAL CLINICAL IMPRESSION(S) / ED DIAGNOSES  Final diagnoses:  Acute dysfunction of right eustachian tube      NEW MEDICATIONS STARTED DURING THIS VISIT:  Discharge Medication List as of 06/25/2017  8:05 AM    START taking these medications   Details  predniSONE (DELTASONE) 10 MG tablet Take 3 tablets once a day for the next 4 days., Print         Note:  This document was prepared using Dragon voice recognition software and may include unintentional dictation errors.    Tommi Rumps, PA-C 06/25/17 1350    Merrily Brittle, MD 06/25/17 2002

## 2017-06-25 NOTE — ED Notes (Signed)
Says right ear pain since Thursday.  Started bactrim twice a day since fri am.  Also trying swimmers ear drops otc.  Says that hurt.  Also put sweet oil in it.  Still painful.

## 2017-06-25 NOTE — Discharge Instructions (Signed)
Follow-up with [facility] clinic if any continued problems. Begin taking medication as directed. Flonase 2 sprays each nostril once a day and prednisone 3 tablets once a day for the next 4 days. Increase fluids.

## 2017-06-25 NOTE — ED Triage Notes (Signed)
Pt reports rt ear pain since Thursday.

## 2017-07-21 ENCOUNTER — Emergency Department
Admission: EM | Admit: 2017-07-21 | Discharge: 2017-07-21 | Disposition: A | Discharge status: Home / Self Care | Payer: Self-pay | Attending: Emergency Medicine | Admitting: Emergency Medicine

## 2017-07-21 ENCOUNTER — Encounter: Payer: Self-pay | Admitting: Emergency Medicine

## 2017-07-21 DIAGNOSIS — J020 Streptococcal pharyngitis: Secondary | ICD-10-CM | POA: Insufficient documentation

## 2017-07-21 DIAGNOSIS — Z79899 Other long term (current) drug therapy: Secondary | ICD-10-CM | POA: Insufficient documentation

## 2017-07-21 DIAGNOSIS — F17210 Nicotine dependence, cigarettes, uncomplicated: Secondary | ICD-10-CM | POA: Insufficient documentation

## 2017-07-21 LAB — POCT RAPID STREP A: Streptococcus, Group A Screen (Direct): NEGATIVE

## 2017-07-21 MED ORDER — AMOXICILLIN 875 MG PO TABS: 875.0000 mg | ORAL_TABLET | Freq: Two times a day (BID) | ORAL | 0 refills | Status: AC

## 2017-07-21 MED ORDER — AMOXICILLIN 500 MG PO CAPS
500.0000 mg | ORAL_CAPSULE | Freq: Once | ORAL | Status: AC
  Administered 2017-07-21: 500 mg via ORAL
  Filled 2017-07-21: qty 1

## 2017-07-21 MED ORDER — DEXAMETHASONE SODIUM PHOSPHATE 10 MG/ML IJ SOLN
10.0000 mg | Freq: Once | INTRAMUSCULAR | Status: AC
  Administered 2017-07-21: 10 mg via INTRAMUSCULAR
  Filled 2017-07-21: qty 1

## 2017-07-21 NOTE — ED Triage Notes (Signed)
Pt comes into the ED via POV c/o sore throat.  Patient has significant h/o strep throat and is positive for white patches on the back of throat.  Patient denies any other symptoms other than chills.  Patient in NAD at this time with even and unlabored respirations.

## 2017-07-21 NOTE — ED Provider Notes (Signed)
[facility] Emergency Department Provider Note  ____________________________________________  Time seen: Approximately 11:00 PM  I have reviewed the triage vital signs and the nursing notes.   HISTORY  Chief Complaint Sore Throat    HPI Gabriella Lane is a 36 y.o. female presents to the emergency department with pharyngitis, headache and abdominal discomfort for the past 2 days.  Patient also reports seeing white patches on the back of her throat and has had anterior neck pain.  She has had chills but has not evaluated her temperature at home.  Patient reports that she experiences multiple episodes of strep pharyngitis per year.  She denies chest pain, chest tightness, shortness of breath, nausea, vomiting abdominal pain.  She is tolerating fluids by mouth and her own secretions.  History reviewed. No pertinent past medical history.  There are no active problems to display for this patient.   Past Surgical History:  Procedure Laterality Date  . TUBAL LIGATION      Prior to Admission medications   Medication Sig Start Date End Date Taking? Authorizing Provider  albuterol (PROVENTIL HFA;VENTOLIN HFA) 108 (90 Base) MCG/ACT inhaler Inhale 2 puffs into the lungs every 6 (six) hours as needed for wheezing or shortness of breath. 08/12/16   Tommi RumpsSummers, Rhonda L, PA-C  amoxicillin (AMOXIL) 875 MG tablet Take 1 tablet (875 mg total) 2 (two) times daily for 10 days by mouth. 07/21/17 07/31/17  Orvil FeilWoods, [place] Brent M, PA-C  fluticasone (FLONASE) 50 MCG/ACT nasal spray Place 2 sprays into both nostrils daily. 06/25/17 06/25/18  Tommi RumpsSummers, Rhonda L, PA-C  predniSONE (DELTASONE) 10 MG tablet Take 3 tablets once a day for the next 4 days. 06/25/17   Tommi RumpsSummers, Rhonda L, PA-C    Allergies Patient has no known allergies.  No family history on file.  Social History Social History   Tobacco Use  . Smoking status: Current Every Day Smoker    Packs/day: 1.00    Types: Cigarettes  .  Smokeless tobacco: Never Used  Substance Use Topics  . Alcohol use: No  . Drug use: No     Review of Systems  Constitutional: Patient has chills Eyes: No visual changes. No discharge ENT: Patient has pharyngitis. Cardiovascular: no chest pain. Respiratory: no cough. No SOB. Gastrointestinal: Patient has abdominal discomfort.  No nausea, no vomiting.  No diarrhea.  No constipation. Musculoskeletal: Negative for musculoskeletal pain. Skin: Negative for rash, abrasions, lacerations, ecchymosis. Neurological: Patient has had headache, no focal weakness or numbness.   ____________________________________________   PHYSICAL EXAM:  VITAL SIGNS: ED Triage Vitals [07/21/17 2148]  Enc Vitals Group     BP 128/89     Pulse Rate (!) 101     Resp 17     Temp 99.1 F (37.3 C)     Temp Source Oral     SpO2 100 %     Weight 180 lb (81.6 kg)     Height 5\' 2"  (1.575 m)     Head Circumference      Peak Flow      Pain Score 6     Pain Loc      Pain Edu?      Excl. in GC?      Constitutional: Alert and oriented. Well appearing and in no acute distress. Eyes: Conjunctivae are normal. PERRL. EOMI. Head: Atraumatic. ENT:      Ears: TMs are pearly bilaterally.      Nose: No congestion/rhinnorhea.      Mouth/Throat: Mucous membranes are  moist.  Posterior pharynx is erythematous with bilateral tonsillar hypertrophy and exudate.  Uvula is midline. Hematological/Lymphatic/Immunilogical: Palpable cervical lymphadenopathy. Cardiovascular: Normal rate, regular rhythm. Normal S1 and S2.  Good peripheral circulation. Respiratory: Normal respiratory effort without tachypnea or retractions. Lungs CTAB. Good air entry to the bases with no decreased or absent breath sounds. Skin:  Skin is warm, dry and intact. No rash noted. Psychiatric: Mood and affect are normal. Speech and behavior are normal. Patient exhibits appropriate insight and judgement.   ____________________________________________    LABS (all labs ordered are listed, but only abnormal results are displayed)  Labs Reviewed  POCT RAPID STREP A   ____________________________________________  EKG   ____________________________________________  RADIOLOGY   No results found.  ____________________________________________    PROCEDURES  Procedure(s) performed:    Procedures    Medications  dexamethasone (DECADRON) injection 10 mg (not administered)  amoxicillin (AMOXIL) capsule 500 mg (not administered)     ____________________________________________   INITIAL IMPRESSION / ASSESSMENT AND PLAN / ED COURSE  Pertinent labs & imaging results that were available during my care of the patient were reviewed by me and considered in my medical decision making (see chart for details).  Review of the [place] CSRS was performed in accordance of the [facility] prior to dispensing any controlled drugs.     Assessment and plan Strep pharyngitis Patient presents to the emergency department with pharyngitis, headache, abdominal discomfort, chills and anterior neck discomfort for the past 2 days.  History and physical exam findings are consistent with strep pharyngitis.  Patient was given amoxicillin in the emergency department.  She was discharged with amoxicillin.  Vital signs were reassuring prior to discharge.  All patient questions were answered.  ____________________________________________  FINAL CLINICAL IMPRESSION(S) / ED DIAGNOSES  Final diagnoses:  Strep pharyngitis      NEW MEDICATIONS STARTED DURING THIS VISIT:  This SmartLink is deprecated. Use AVSMEDLIST instead to display the medication list for a patient.      This chart was dictated using voice recognition software/Dragon. Despite best efforts to proofread, errors can occur which can change the meaning. Any change was purely unintentional.    Orvil Feil, PA-C 07/21/17 2304    Jeanmarie Plant, MD 07/25/17 [phone]

## 2017-07-22 ENCOUNTER — Other Ambulatory Visit: Payer: Self-pay | Admitting: Emergency Medicine

## 2017-08-08 ENCOUNTER — Encounter: Payer: Self-pay | Admitting: Emergency Medicine

## 2017-08-08 ENCOUNTER — Emergency Department
Admission: EM | Admit: 2017-08-08 | Discharge: 2017-08-08 | Disposition: A | Discharge status: Home / Self Care | Payer: Medicaid Other | Attending: Emergency Medicine | Admitting: Emergency Medicine

## 2017-08-08 ENCOUNTER — Other Ambulatory Visit: Payer: Self-pay

## 2017-08-08 DIAGNOSIS — F17210 Nicotine dependence, cigarettes, uncomplicated: Secondary | ICD-10-CM | POA: Insufficient documentation

## 2017-08-08 DIAGNOSIS — J020 Streptococcal pharyngitis: Secondary | ICD-10-CM

## 2017-08-08 DIAGNOSIS — Z79899 Other long term (current) drug therapy: Secondary | ICD-10-CM | POA: Insufficient documentation

## 2017-08-08 LAB — POCT RAPID STREP A: Streptococcus, Group A Screen (Direct): POSITIVE — AB

## 2017-08-08 MED ORDER — PENICILLIN G BENZATHINE 1200000 UNIT/2ML IM SUSP
1.2000 10*6.[IU] | Freq: Once | INTRAMUSCULAR | Status: AC
  Administered 2017-08-08: 1.2 10*6.[IU] via INTRAMUSCULAR
  Filled 2017-08-08: qty 2

## 2017-08-08 MED ORDER — TRAMADOL HCL 50 MG PO TABS
50.0000 mg | ORAL_TABLET | Freq: Once | ORAL | Status: AC
  Administered 2017-08-08: 50 mg via ORAL
  Filled 2017-08-08: qty 1

## 2017-08-08 MED ORDER — DEXAMETHASONE SODIUM PHOSPHATE 10 MG/ML IJ SOLN
10.0000 mg | Freq: Once | INTRAMUSCULAR | Status: AC
  Administered 2017-08-08: 10 mg via INTRAMUSCULAR
  Filled 2017-08-08: qty 1

## 2017-08-08 MED ORDER — MAGIC MOUTHWASH W/LIDOCAINE
5.0000 mL | Freq: Four times a day (QID) | ORAL | 0 refills | Status: DC
Start: 2017-08-08 — End: 2017-11-29

## 2017-08-08 NOTE — ED Provider Notes (Signed)
[facility] Emergency Department Provider Note  ____________________________________________  Time seen: Approximately 3:18 PM  I have reviewed the triage vital signs and the nursing notes.   HISTORY  Chief Complaint Sore Throat    HPI Gabriella Lane is a 36 y.o. female who presents emergency department complaining of fevers and chills, sore throat, body aches times 2 days.  Patient reports that she has been exposed to influenza but is also has a history of repeated strep infections.  Patient reports that she has been taking Tylenol at home.  Patient denies any headache, visual changes, neck pain or stiffness, chest pain, cough, shortness of breath, abdominal pain, nausea or vomiting, diarrhea or constipation.  History reviewed. No pertinent past medical history.  There are no active problems to display for this patient.   Past Surgical History:  Procedure Laterality Date  . TUBAL LIGATION      Prior to Admission medications   Medication Sig Start Date End Date Taking? Authorizing Provider  albuterol (PROVENTIL HFA;VENTOLIN HFA) 108 (90 Base) MCG/ACT inhaler Inhale 2 puffs into the lungs every 6 (six) hours as needed for wheezing or shortness of breath. 08/12/16   Tommi RumpsSummers, Rhonda L, PA-C  fluticasone (FLONASE) 50 MCG/ACT nasal spray Place 2 sprays into both nostrils daily. 06/25/17 06/25/18  Tommi RumpsSummers, Rhonda L, PA-C  predniSONE (DELTASONE) 10 MG tablet Take 3 tablets once a day for the next 4 days. 06/25/17   Tommi RumpsSummers, Rhonda L, PA-C    Allergies Patient has no known allergies.  No family history on file.  Social History Social History   Tobacco Use  . Smoking status: Current Every Day Smoker    Packs/day: 1.00    Types: Cigarettes  . Smokeless tobacco: Never Used  Substance Use Topics  . Alcohol use: No  . Drug use: No     Review of Systems  Constitutional: No fever/chills Eyes: No visual changes. No discharge ENT: Positive for sore  throat Cardiovascular: no chest pain. Respiratory: no cough. No SOB. Gastrointestinal: No abdominal pain.  No nausea, no vomiting.  No diarrhea.  No constipation. Genitourinary: Negative for dysuria. No hematuria Musculoskeletal: Negative for musculoskeletal pain. Skin: Negative for rash, abrasions, lacerations, ecchymosis. Neurological: Negative for headaches, focal weakness or numbness. 10-point ROS otherwise negative.  ____________________________________________   PHYSICAL EXAM:  VITAL SIGNS: ED Triage Vitals  Enc Vitals Group     BP 08/08/17 1440 (!) 127/92     Pulse Rate 08/08/17 1440 (!) 110     Resp 08/08/17 1440 20     Temp 08/08/17 1440 99.8 F (37.7 C)     Temp Source 08/08/17 1440 Oral     SpO2 08/08/17 1440 98 %     Weight 08/08/17 1441 180 lb (81.6 kg)     Height 08/08/17 1441 5\' 2"  (1.575 m)     Head Circumference --      Peak Flow --      Pain Score 08/08/17 1440 9     Pain Loc --      Pain Edu? --      Excl. in GC? --      Constitutional: Alert and oriented. Well appearing and in no acute distress. Eyes: Conjunctivae are normal. PERRL. EOMI. Head: Atraumatic. ENT:      Ears: EACs and TMs unremarkable bilaterally.      Nose: No congestion/rhinnorhea.      Mouth/Throat: Mucous membranes are moist.  Tonsils are grossly edematous, erythematous, with significant exudates of the right  tonsil.  Uvula is midline. Neck: No stridor.  Neck is supple full range of motion Hematological/Lymphatic/Immunilogical: Diffuse, mobile, tender anterior cervical lymphadenopathy. Cardiovascular: Normal rate, regular rhythm. Normal S1 and S2.  Good peripheral circulation. Respiratory: Normal respiratory effort without tachypnea or retractions. Lungs CTAB. Good air entry to the bases with no decreased or absent breath sounds. Musculoskeletal: Full range of motion to all extremities. No gross deformities appreciated. Neurologic:  Normal speech and language. No gross focal  neurologic deficits are appreciated.  Skin:  Skin is warm, dry and intact. No rash noted. Psychiatric: Mood and affect are normal. Speech and behavior are normal. Patient exhibits appropriate insight and judgement.   ____________________________________________   LABS (all labs ordered are listed, but only abnormal results are displayed)  Labs Reviewed  POCT RAPID STREP A - Abnormal; Notable for the following components:      Result Value   Streptococcus, Group A Screen (Direct) POSITIVE (*)    All other components within normal limits   ____________________________________________  EKG   ____________________________________________  RADIOLOGY   No results found.  ____________________________________________    PROCEDURES  Procedure(s) performed:    Procedures    Medications  dexamethasone (DECADRON) injection 10 mg (not administered)  penicillin g benzathine (BICILLIN LA) 1200000 UNIT/2ML injection 1.2 Million Units (not administered)  traMADol (ULTRAM) tablet 50 mg (not administered)     ____________________________________________   INITIAL IMPRESSION / ASSESSMENT AND PLAN / ED COURSE  Pertinent labs & imaging results that were available during my care of the patient were reviewed by me and considered in my medical decision making (see chart for details).  Review of the [place] CSRS was performed in accordance of the [facility] prior to dispensing any controlled drugs.     Patient's diagnosis is consistent with strep pharyngitis.  Patient has had strep symptoms for the past 2 days with positive strep test.  Patient is also complaining of body aches and fevers and has been exposed to influenza.  At this time, discussed at length with patient pros and cons of using Tamiflu.  Patient declines at this time.  I feel this is reasonable.  Patient is given injection of penicillin for her strep, injection of Decadron for inflammation of the tonsils.  Patient will be treated  with Magic mouthwash for additional symptom control over the next several days..  Patient will follow up with primary care or ENT as necessary.  Patient is given ED precautions to return to the ED for any worsening or new symptoms.     ____________________________________________  FINAL CLINICAL IMPRESSION(S) / ED DIAGNOSES  Final diagnoses:  Strep pharyngitis      NEW MEDICATIONS STARTED DURING THIS VISIT:  ED Discharge Orders    None          This chart was dictated using voice recognition software/Dragon. Despite best efforts to proofread, errors can occur which can change the meaning. Any change was purely unintentional.    Racheal PatchesCuthriell, Jonathan D, PA-C 08/08/17 1533    Governor RooksLord, Rebecca, MD 08/08/17 1815

## 2017-08-08 NOTE — ED Triage Notes (Signed)
Sore throat and chills x2 days

## 2017-09-12 ENCOUNTER — Encounter: Payer: Self-pay | Admitting: Emergency Medicine

## 2017-09-12 ENCOUNTER — Emergency Department
Admission: EM | Admit: 2017-09-12 | Discharge: 2017-09-12 | Disposition: A | Discharge status: Home / Self Care | Payer: Medicaid Other | Attending: Emergency Medicine | Admitting: Emergency Medicine

## 2017-09-12 DIAGNOSIS — J039 Acute tonsillitis, unspecified: Secondary | ICD-10-CM | POA: Insufficient documentation

## 2017-09-12 DIAGNOSIS — F17210 Nicotine dependence, cigarettes, uncomplicated: Secondary | ICD-10-CM | POA: Insufficient documentation

## 2017-09-12 MED ORDER — MAGIC MOUTHWASH W/LIDOCAINE: 5.0000 mL | Freq: Four times a day (QID) | ORAL | 0 refills | Status: DC

## 2017-09-12 MED ORDER — PENICILLIN G BENZATHINE 1200000 UNIT/2ML IM SUSP
1.2000 10*6.[IU] | Freq: Once | INTRAMUSCULAR | Status: AC
  Administered 2017-09-12: 1.2 10*6.[IU] via INTRAMUSCULAR
  Filled 2017-09-12: qty 2

## 2017-09-12 NOTE — ED Provider Notes (Signed)
[facility] Emergency Department Provider Note   ____________________________________________   First MD Initiated Contact with Patient 09/12/17 1454     (approximate)  I have reviewed the triage vital signs and the nursing notes.   HISTORY  Chief Complaint Sore Throat    HPI Gabriella Lane is a 36 y.o. female patient presented with sore throat with significant history of recurrent strep pharyngitis. Patient state pain with swallowing but can tolerate food and fluids. Patient denies URI signs and symptoms. No palliative measures for complaint.Patient rates her pain discomfort as a 5/10. Patient describes the pain as "sore".   History reviewed. No pertinent past medical history.  There are no active problems to display for this patient.   Past Surgical History:  Procedure Laterality Date  . TUBAL LIGATION      Prior to Admission medications   Medication Sig Start Date End Date Taking? Authorizing Provider  albuterol (PROVENTIL HFA;VENTOLIN HFA) 108 (90 Base) MCG/ACT inhaler Inhale 2 puffs into the lungs every 6 (six) hours as needed for wheezing or shortness of breath. 08/12/16   Tommi RumpsSummers, Rhonda L, PA-C  fluticasone (FLONASE) 50 MCG/ACT nasal spray Place 2 sprays into both nostrils daily. 06/25/17 06/25/18  Tommi RumpsSummers, Rhonda L, PA-C  magic mouthwash w/lidocaine SOLN Take 5 mLs by mouth 4 (four) times daily. 08/08/17   Cuthriell, Delorise RoyalsJonathan D, PA-C  magic mouthwash w/lidocaine SOLN Take 5 mLs by mouth 4 (four) times daily. Swish and swallow 09/12/17   Joni ReiningSmith, Champ Keetch K, PA-C  predniSONE (DELTASONE) 10 MG tablet Take 3 tablets once a day for the next 4 days. 06/25/17   Tommi RumpsSummers, Rhonda L, PA-C    Allergies Patient has no known allergies.  No family history on file.  Social History Social History   Tobacco Use  . Smoking status: Current Every Day Smoker    Packs/day: 1.00    Types: Cigarettes  . Smokeless tobacco: Never Used  Substance Use Topics  .  Alcohol use: No  . Drug use: No    Review of Systems  Constitutional: No fever/chills Eyes: No visual changes. ENT: Sore throat Cardiovascular: Denies chest pain. Respiratory: Denies shortness of breath. Gastrointestinal: No abdominal pain.  No nausea, no vomiting.  No diarrhea.  No constipation. Genitourinary: Negative for dysuria. Musculoskeletal: Negative for back pain. Skin: Negative for rash. Neurological: Negative for headaches, focal weakness or numbness.   ____________________________________________   PHYSICAL EXAM:  VITAL SIGNS: ED Triage Vitals  Enc Vitals Group     BP 09/12/17 1441 131/87     Pulse Rate 09/12/17 1441 93     Resp 09/12/17 1441 16     Temp 09/12/17 1441 99 F (37.2 C)     Temp Source 09/12/17 1441 Oral     SpO2 09/12/17 1441 97 %     Weight 09/12/17 1437 180 lb (81.6 kg)     Height 09/12/17 1437 5\' 2"  (1.575 m)     Head Circumference --      Peak Flow --      Pain Score 09/12/17 1436 5     Pain Loc --      Pain Edu? --      Excl. in GC? --     Constitutional: Alert and oriented. Well appearing and in no acute distress. Nose: No congestion/rhinnorhea. Mouth/Throat: Mucous membranes are moist.  Oropharynx erythematous. Bilateral exudative tonsils Neck: No stridor. Hematological/Lymphatic/Immunilogical: Bilateral cervical lymphadenopathy. Cardiovascular: Normal rate, regular rhythm. Grossly normal heart sounds.  Good peripheral circulation.  Respiratory: Normal respiratory effort.  No retractions. Lungs CTAB. Gastrointestinal: Soft and nontender. No distention. No abdominal bruits. No CVA tenderness. Musculoskeletal: No lower extremity tenderness nor edema.  No joint effusions. Neurologic:  Normal speech and language. No gross focal neurologic deficits are appreciated. No gait instability. Skin:  Skin is warm, dry and intact. No rash noted. Psychiatric: Mood and affect are normal. Speech and behavior are  normal.  ____________________________________________   LABS (all labs ordered are listed, but only abnormal results are displayed)  Labs Reviewed - No data to display ____________________________________________  EKG   ____________________________________________  RADIOLOGY  No results found.  ____________________________________________   PROCEDURES  Procedure(s) performed: None  Procedures  Critical Care performed: No  ____________________________________________   INITIAL IMPRESSION / ASSESSMENT AND PLAN / ED COURSE  As part of my medical decision making, I reviewed the following data within the electronic MEDICAL RECORD NUMBER    Sore throat secondary to tonsillitis. Patient given Bicillin IM. Patient given discharge care instructions. Patient advised to follow-up with the dental clinic if condition persists.      ____________________________________________   FINAL CLINICAL IMPRESSION(S) / ED DIAGNOSES  Final diagnoses:  Tonsillitis     ED Discharge Orders        Ordered    magic mouthwash w/lidocaine SOLN  4 times daily    Comments:  Mix 30 milliliters of Benadryl, 30 mL of viscous lidocaine, and 30 mL of nystatin   09/12/17 1453       Note:  This document was prepared using Dragon voice recognition software and may include unintentional dictation errors.    Joni ReiningSmith, Cade Olberding K, PA-C 09/12/17 1457    Emily FilbertWilliams, Jonathan E, MD 09/12/17 [phone]

## 2017-09-12 NOTE — ED Notes (Signed)
Pt ambulatory to POV with her mother. NAD. Discharge instructions and RX reviewed. No questions or comments voiced during discharge instructions. VSS

## 2017-09-12 NOTE — ED Triage Notes (Signed)
Pt comes into the ED via POV c/o sore throat.   Patient has significant h/o strep and has white patches present at this time.  Denies any fevers per patient.

## 2017-11-21 ENCOUNTER — Other Ambulatory Visit: Payer: Self-pay

## 2017-11-21 ENCOUNTER — Encounter: Payer: Self-pay | Admitting: Emergency Medicine

## 2017-11-21 ENCOUNTER — Emergency Department
Admission: EM | Admit: 2017-11-21 | Discharge: 2017-11-21 | Disposition: A | Discharge status: Home / Self Care | Payer: Self-pay | Attending: Emergency Medicine | Admitting: Emergency Medicine

## 2017-11-21 DIAGNOSIS — F17210 Nicotine dependence, cigarettes, uncomplicated: Secondary | ICD-10-CM | POA: Insufficient documentation

## 2017-11-21 DIAGNOSIS — Z79899 Other long term (current) drug therapy: Secondary | ICD-10-CM | POA: Insufficient documentation

## 2017-11-21 DIAGNOSIS — K649 Unspecified hemorrhoids: Secondary | ICD-10-CM | POA: Insufficient documentation

## 2017-11-21 HISTORY — DX: Unspecified hemorrhoids: K64.9

## 2017-11-21 MED ORDER — LIDOCAINE 5 % EX OINT: 1.0000 "application " | TOPICAL_OINTMENT | CUTANEOUS | 1 refills | Status: DC | PRN

## 2017-11-21 MED ORDER — DOCUSATE SODIUM 100 MG PO CAPS: 100.0000 mg | ORAL_CAPSULE | Freq: Every day | ORAL | 2 refills | Status: AC | PRN

## 2017-11-21 NOTE — ED Provider Notes (Signed)
[facility] Emergency Department Provider Note  ____________________________________________  Time seen: Approximately 6:13 PM  I have reviewed the triage vital signs and the nursing notes.   HISTORY  Chief Complaint Hemorrhoids    HPI Gabriella Lane is a 37 y.o. female's emergency department complaining of her hemorrhoids.  Patient reports that she has had significant pain from her known hemorrhoids.  She has had these for approximately 10 years after her daughter was born.  Patient has never been seen by GI or general surgery for the hemorrhoids.  She reports increased pain and some mild bright red spotting when wiping.  No blood in her stools.  No blood in the toilet bowl.  Patient denies any constipation.  Patient states that she "can feel 1 of them" on the external surface.  She has been using Preparation H as well as trying suppositories for this complaint.  Patient is not tried stool softener.  No other complaints.  Patient denies any headache, chest pain, runs of breath, abdominal pain, diarrhea or constipation.  Past Medical History:  Diagnosis Date  . Hemorrhoid     There are no active problems to display for this patient.   Past Surgical History:  Procedure Laterality Date  . TUBAL LIGATION      Prior to Admission medications   Medication Sig Start Date End Date Taking? Authorizing Provider  albuterol (PROVENTIL HFA;VENTOLIN HFA) 108 (90 Base) MCG/ACT inhaler Inhale 2 puffs into the lungs every 6 (six) hours as needed for wheezing or shortness of breath. 08/12/16   Tommi Rumps, PA-C  docusate sodium (COLACE) 100 MG capsule Take 1 capsule (100 mg total) by mouth daily as needed. 11/21/17 11/21/18  Cyris Maalouf, Delorise Royals, PA-C  fluticasone (FLONASE) 50 MCG/ACT nasal spray Place 2 sprays into both nostrils daily. 06/25/17 06/25/18  Bridget Hartshorn L, PA-C  lidocaine (XYLOCAINE) 5 % ointment Apply 1 application topically as needed. 11/21/17   Langdon Crosson,  Delorise Royals, PA-C  magic mouthwash w/lidocaine SOLN Take 5 mLs by mouth 4 (four) times daily. 08/08/17   Barbarann Kelly, Delorise Royals, PA-C  magic mouthwash w/lidocaine SOLN Take 5 mLs by mouth 4 (four) times daily. Swish and swallow 09/12/17   Joni Reining, PA-C  predniSONE (DELTASONE) 10 MG tablet Take 3 tablets once a day for the next 4 days. 06/25/17   Tommi Rumps, PA-C    Allergies Patient has no known allergies.  History reviewed. No pertinent family history.  Social History Social History   Tobacco Use  . Smoking status: Current Every Day Smoker    Packs/day: 1.00    Types: Cigarettes  . Smokeless tobacco: Never Used  Substance Use Topics  . Alcohol use: No  . Drug use: No     Review of Systems  Constitutional: No fever/chills Eyes: No visual changes.  Cardiovascular: no chest pain. Respiratory: no cough. No SOB. Gastrointestinal: No abdominal pain.  No nausea, no vomiting.  No diarrhea.  No constipation.  Positive for pain from hemorrhoids.   Musculoskeletal: Negative for musculoskeletal pain. Skin: Negative for rash, abrasions, lacerations, ecchymosis. Neurological: Negative for headaches, focal weakness or numbness. 10-point ROS otherwise negative.   ____________________________________________   PHYSICAL EXAM:  VITAL SIGNS: ED Triage Vitals [11/21/17 1806]  Enc Vitals Group     BP 119/75     Pulse Rate 84     Resp 18     Temp 98.2 F (36.8 C)     Temp Source Oral  SpO2 97 %     Weight 185 lb (83.9 kg)     Height 5\' 2"  (1.575 m)     Head Circumference      Peak Flow      Pain Score 6     Pain Loc      Pain Edu?      Excl. in GC?      Constitutional: Alert and oriented. Well appearing and in no acute distress. Eyes: Conjunctivae are normal. PERRL. EOMI. Head: Atraumatic. ENT:      Ears:       Nose: No congestion/rhinnorhea.      Mouth/Throat: Mucous membranes are moist.  Neck: No stridor.    Cardiovascular: Normal rate, regular  rhythm. Normal S1 and S2.  Good peripheral circulation. Respiratory: Normal respiratory effort without tachypnea or retractions. Lungs CTAB. Good air entry to the bases with no decreased or absent breath sounds. Gastrointestinal: Bowel sounds 4 quadrants. Soft and nontender to palpation. No guarding or rigidity. No palpable masses. No distention. Visualization of the anus reveals no external or thrombosed hemorrhoids.  Palpation reveals significant pain.  No visualized bleeding. Musculoskeletal: Full range of motion to all extremities. No gross deformities appreciated. Neurologic:  Normal speech and language. No gross focal neurologic deficits are appreciated.  Skin:  Skin is warm, dry and intact. No rash noted. Psychiatric: Mood and affect are normal. Speech and behavior are normal. Patient exhibits appropriate insight and judgement.  Exam undertaken with female chaperone in the room. ____________________________________________   LABS (all labs ordered are listed, but only abnormal results are displayed)  Labs Reviewed - No data to display ____________________________________________  EKG   ____________________________________________  RADIOLOGY   No results found.  ____________________________________________    PROCEDURES  Procedure(s) performed:    Procedures    Medications - No data to display   ____________________________________________   INITIAL IMPRESSION / ASSESSMENT AND PLAN / ED COURSE  Pertinent labs & imaging results that were available during my care of the patient were reviewed by me and considered in my medical decision making (see chart for details).  Review of the [place] CSRS was performed in accordance of the [facility] prior to dispensing any controlled drugs.     Patient's diagnosis is consistent with hemorrhoids.  Initial differential included hemorrhoids constipation, anal fissure, anal/rectal tear.  Patient is presenting with increased pain  from her known hemorrhoids.  No external, thrombosed hemorrhoids are visible.  Patient has seen some mild spotting with wiping but no indication of significant GI bleed.  At this time, no labs or imaging deemed necessary.. Patient will be discharged home with prescriptions for topical lidocaine and stool softener for symptom improvement. Patient is to follow up with general surgery for hemorrhoidectomy primary care as needed or otherwise directed. Patient is given ED precautions to return to the ED for any worsening or new symptoms.     ____________________________________________  FINAL CLINICAL IMPRESSION(S) / ED DIAGNOSES  Final diagnoses:  Hemorrhoids, unspecified hemorrhoid type      NEW MEDICATIONS STARTED DURING THIS VISIT:  ED Discharge Orders        Ordered    lidocaine (XYLOCAINE) 5 % ointment  As needed     11/21/17 1829    docusate sodium (COLACE) 100 MG capsule  Daily PRN     11/21/17 1829          This chart was dictated using voice recognition software/Dragon. Despite best efforts to proofread, errors can occur which can change the  meaning. Any change was purely unintentional.    Racheal PatchesCuthriell, Caydence Enck D, PA-C 11/21/17 1830    Myrna BlazerSchaevitz, David Matthew, MD 11/21/17 2237

## 2017-11-21 NOTE — ED Triage Notes (Signed)
Here for hemorrhoids.  Has had ever since kids. Has tried OTC meds without relief.  On outside per pt.  Pain when sitting. ambulatory without difficulty.

## 2017-11-29 ENCOUNTER — Telehealth: Payer: Self-pay

## 2017-11-29 ENCOUNTER — Ambulatory Visit: Payer: Self-pay | Admitting: Surgery

## 2017-11-29 ENCOUNTER — Other Ambulatory Visit: Payer: Self-pay

## 2017-11-29 ENCOUNTER — Encounter: Payer: Self-pay | Admitting: Surgery

## 2017-11-29 VITALS — BP 123/82 | HR 87 | Temp 98.0°F | Ht 62.0 in | Wt 195.0 lb

## 2017-11-29 DIAGNOSIS — K602 Anal fissure, unspecified: Secondary | ICD-10-CM

## 2017-11-29 NOTE — Telephone Encounter (Signed)
[facility] at [facility]. Called in Nifedipine ointment for patient.  Call made to patient at this time. I verbalized to her that I had [facility] the [facility] and that they will give her a call once medication is ready for pick up. She verbalized understanding.

## 2017-11-29 NOTE — Patient Instructions (Signed)
We will see you back as listed below:  We have sent in Nifedipine Cream to [facility] [place],  [facility] [address] [place], [place] [id number] [phone]  We also want you to add fiber to your daily diet such as Benefiber or Metamucli.   How to Take a Sitz Bath A sitz bath is a warm water bath that is taken while you are sitting down. The water should only come up to your hips and should cover your buttocks. Your health care provider may recommend a sitz bath to help you:  Clean the lower part of your body, including your genital area.  With itching.  With pain.  With sore muscles or muscles that tighten or spasm.  How to take a sitz bath Take 3-4 sitz baths per day or as told by your health care provider. 1. Partially fill a bathtub with warm water. You will only need the water to be deep enough to cover your hips and buttocks when you are sitting in it. 2. If your health care provider told you to put medicine in the water, follow the directions exactly. 3. Sit in the water and open the tub drain a little. 4. Turn on the warm water again to keep the tub at the correct level. Keep the water running constantly. 5. Soak in the water for 15-20 minutes or as told by your health care provider. 6. After the sitz bath, pat the affected area dry first. Do not rub it. 7. Be careful when you stand up after the sitz bath because you may feel dizzy.  Contact a health care provider if:  Your symptoms get worse. Do not continue with sitz baths if your symptoms get worse.  You have new symptoms. Do not continue with sitz baths until you talk with your health care provider. This information is not intended to replace advice given to you by your health care provider. Make sure you discuss any questions you have with your health care provider. Document Released: 05/26/2004 Document Revised: 02/01/2016 Document Reviewed: 09/01/2014 Elsevier Interactive Patient Education   2018 Elsevier Inc.     High-Fiber Diet Fiber, also called dietary fiber, is a type of carbohydrate found in fruits, vegetables, whole grains, and beans. A high-fiber diet can have many health benefits. Your health care provider may recommend a high-fiber diet to help:  Prevent constipation. Fiber can make your bowel movements more regular.  Lower your cholesterol.  Relieve hemorrhoids, uncomplicated diverticulosis, or irritable bowel syndrome.  Prevent overeating as part of a weight-loss plan.  Prevent heart disease, type 2 diabetes, and certain cancers.  What is my plan? The recommended daily intake of fiber includes:  38 grams for men under age 35.  30 grams for men over age 35.  25 grams for women under age 35.  21 grams for women over age 35.  You can get the recommended daily intake of dietary fiber by eating a variety of fruits, vegetables, grains, and beans. Your health care provider may also recommend a fiber supplement if it is not possible to get enough fiber through your diet. What do I need to know about a high-fiber diet?  Fiber supplements have not been widely studied for their effectiveness, so it is better to get fiber through food sources.  Always check the fiber content on thenutrition facts label of any prepackaged food. Look for foods that contain at least 5 grams of fiber per serving.  Ask your dietitian  if you have questions about specific foods that are related to your condition, especially if those foods are not listed in the following section.  Increase your daily fiber consumption gradually. Increasing your intake of dietary fiber too quickly may cause bloating, cramping, or gas.  Drink plenty of water. Water helps you to digest fiber. What foods can I eat? Grains Whole-grain breads. Multigrain cereal. Oats and oatmeal. Brown rice. Barley. Bulgur wheat. Millet. Bran muffins. Popcorn. Rye wafer crackers. Vegetables Sweet potatoes. Spinach. Kale.  Artichokes. Cabbage. Broccoli. Green peas. Carrots. Squash. Fruits Berries. Pears. Apples. Oranges. Avocados. Prunes and raisins. Dried figs. Meats and Other Protein Sources Navy, kidney, pinto, and soy beans. Split peas. Lentils. Nuts and seeds. Dairy Fiber-fortified yogurt. Beverages Fiber-fortified soy milk. Fiber-fortified orange juice. Other Fiber bars. The items listed above may not be a complete list of recommended foods or beverages. Contact your dietitian for more options. What foods are not recommended? Grains White bread. Pasta made with refined flour. White rice. Vegetables Fried potatoes. Canned vegetables. Well-cooked vegetables. Fruits Fruit juice. Cooked, strained fruit. Meats and Other Protein Sources Fatty cuts of meat. Fried Environmental education officer or fried fish. Dairy Milk. Yogurt. Cream cheese. Sour cream. Beverages Soft drinks. Other Cakes and pastries. Butter and oils. The items listed above may not be a complete list of foods and beverages to avoid. Contact your dietitian for more information. What are some tips for including high-fiber foods in my diet?  Eat a wide variety of high-fiber foods.  Make sure that half of all grains consumed each day are whole grains.  Replace breads and cereals made from refined flour or white flour with whole-grain breads and cereals.  Replace white rice with brown rice, bulgur wheat, or millet.  Start the day with a breakfast that is high in fiber, such as a cereal that contains at least 5 grams of fiber per serving.  Use beans in place of meat in soups, salads, or pasta.  Eat high-fiber snacks, such as berries, raw vegetables, nuts, or popcorn. This information is not intended to replace advice given to you by your health care provider. Make sure you discuss any questions you have with your health care provider. Document Released: 09/03/2005 Document Revised: 02/09/2016 Document Reviewed: 02/16/2014 Elsevier Interactive Patient  Education  [employer].

## 2017-12-02 ENCOUNTER — Encounter: Payer: Self-pay | Admitting: Surgery

## 2017-12-02 NOTE — Progress Notes (Signed)
  Surgical Consultation  12/02/2017  Gabriella Lane is an 37 y.o. female.   Chief Complaint  Patient presents with  . New Patient (Initial Visit)     new patient Hemorrhoids, unspecified hemorrhoid type     HPI: Gabriella BumpsJessica is refer by Dr.Scahevitz for hemorrhoids.  Reports she has had hemorrhoids for more than 10 years now some itching and discomfort.  Over the last few days she has experienced severe pain that is sharp and she states that she feels like she is passing razor blades.  The pain is worse when she has a bowel movement.  There is no hematochezia.  He has been doing some Preparation H and lidocaine without any relief.  Past Medical History:  Diagnosis Date  . Hemorrhoid     Past Surgical History:  Procedure Laterality Date  . TUBAL LIGATION      History reviewed. No pertinent family history.  Social History:  reports that she has been smoking cigarettes.  She has been smoking about 1.00 pack per day. she has never used smokeless tobacco. She reports that she does not drink alcohol or use drugs.  Allergies: No Known Allergies  Medications reviewed.     ROS Full ROS performed and is otherwise negative other than what is stated in the HPI    BP 123/82   Pulse 87   Temp 98 F (36.7 C) (Oral)   Ht 5\' 2"  (1.575 m)   Wt 88.5 kg (195 lb)   LMP 11/07/2017 (Approximate)   BMI 35.67 kg/m   Physical Exam  Constitutional: She is oriented to person, place, and time and well-developed, well-nourished, and in no distress. No distress.  Eyes: Right eye exhibits no discharge. Left eye exhibits no discharge. No scleral icterus.  Pulmonary/Chest: Effort normal. No respiratory distress.  Abdominal: Soft. She exhibits no distension and no mass. There is no tenderness. There is no rebound and no guarding.  Genitourinary:  Genitourinary Comments: There is a grade III internal hemorrhoid , more importantly there is a post midline fissure,. No masses  Musculoskeletal: Normal  range of motion.  Neurological: She is alert and oriented to person, place, and time. Gait normal. GCS score is 15.  Skin: Skin is warm and dry. She is not diaphoretic.  Psychiatric: Mood, memory, affect and judgment normal.  Nursing note and vitals reviewed.     Assessment/Plan: Anal fissure.  Discussed with the patient and detail about treatment with nifedipine cream, sitz baths, high-fiber diet.  I will see her back in 3 weeks and if symptoms do not improve we will entertain Botox injection.  Need for emergent surgical intervention at this time. Safe counseling provided regarding medical care of the fissures.  Sterling Bigiego Shedrick Sarli, MD [facility] General Surgeon

## 2017-12-04 ENCOUNTER — Telehealth: Payer: Self-pay | Admitting: Surgery

## 2017-12-04 NOTE — Telephone Encounter (Signed)
Disability paperwork was faxed in on patient,patient has called and paid disability paperwork form fee, and paperwork has been placed in folder up front.

## 2017-12-06 NOTE — Telephone Encounter (Signed)
FMLA paperwork completed and faxed to [facility].  Patient notified.

## 2017-12-08 IMAGING — CR DG CHEST 2V
1 series · 2 of 2 positions shown · non-contrast
Comparison: None.

CLINICAL DATA: Cough, intermittent fever

EXAM:
CHEST  2 VIEW

[Series 1: dg chest 2 view · 0.14mm/px · 2 of 2 slices shown]
[im 1/2]
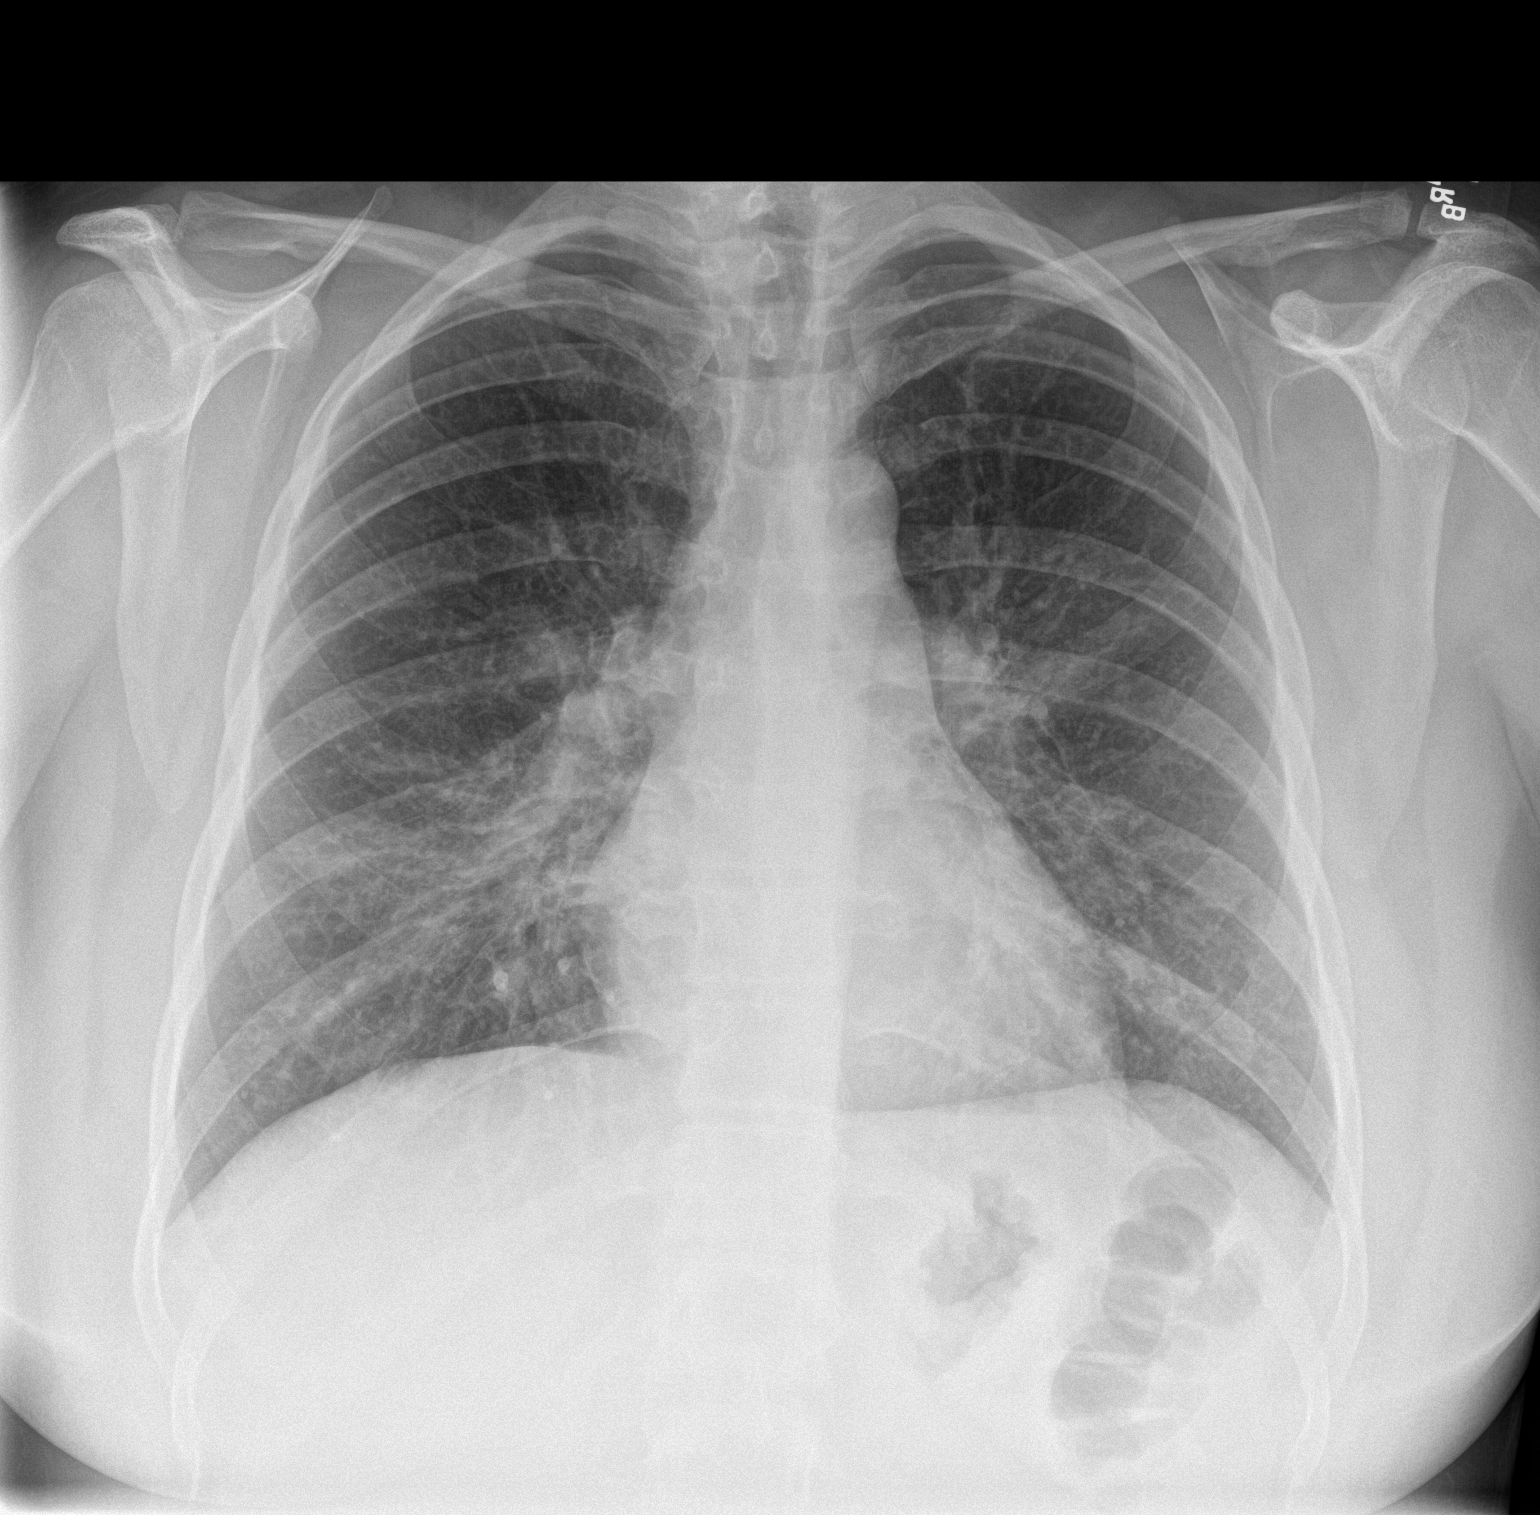
[im 2/2]
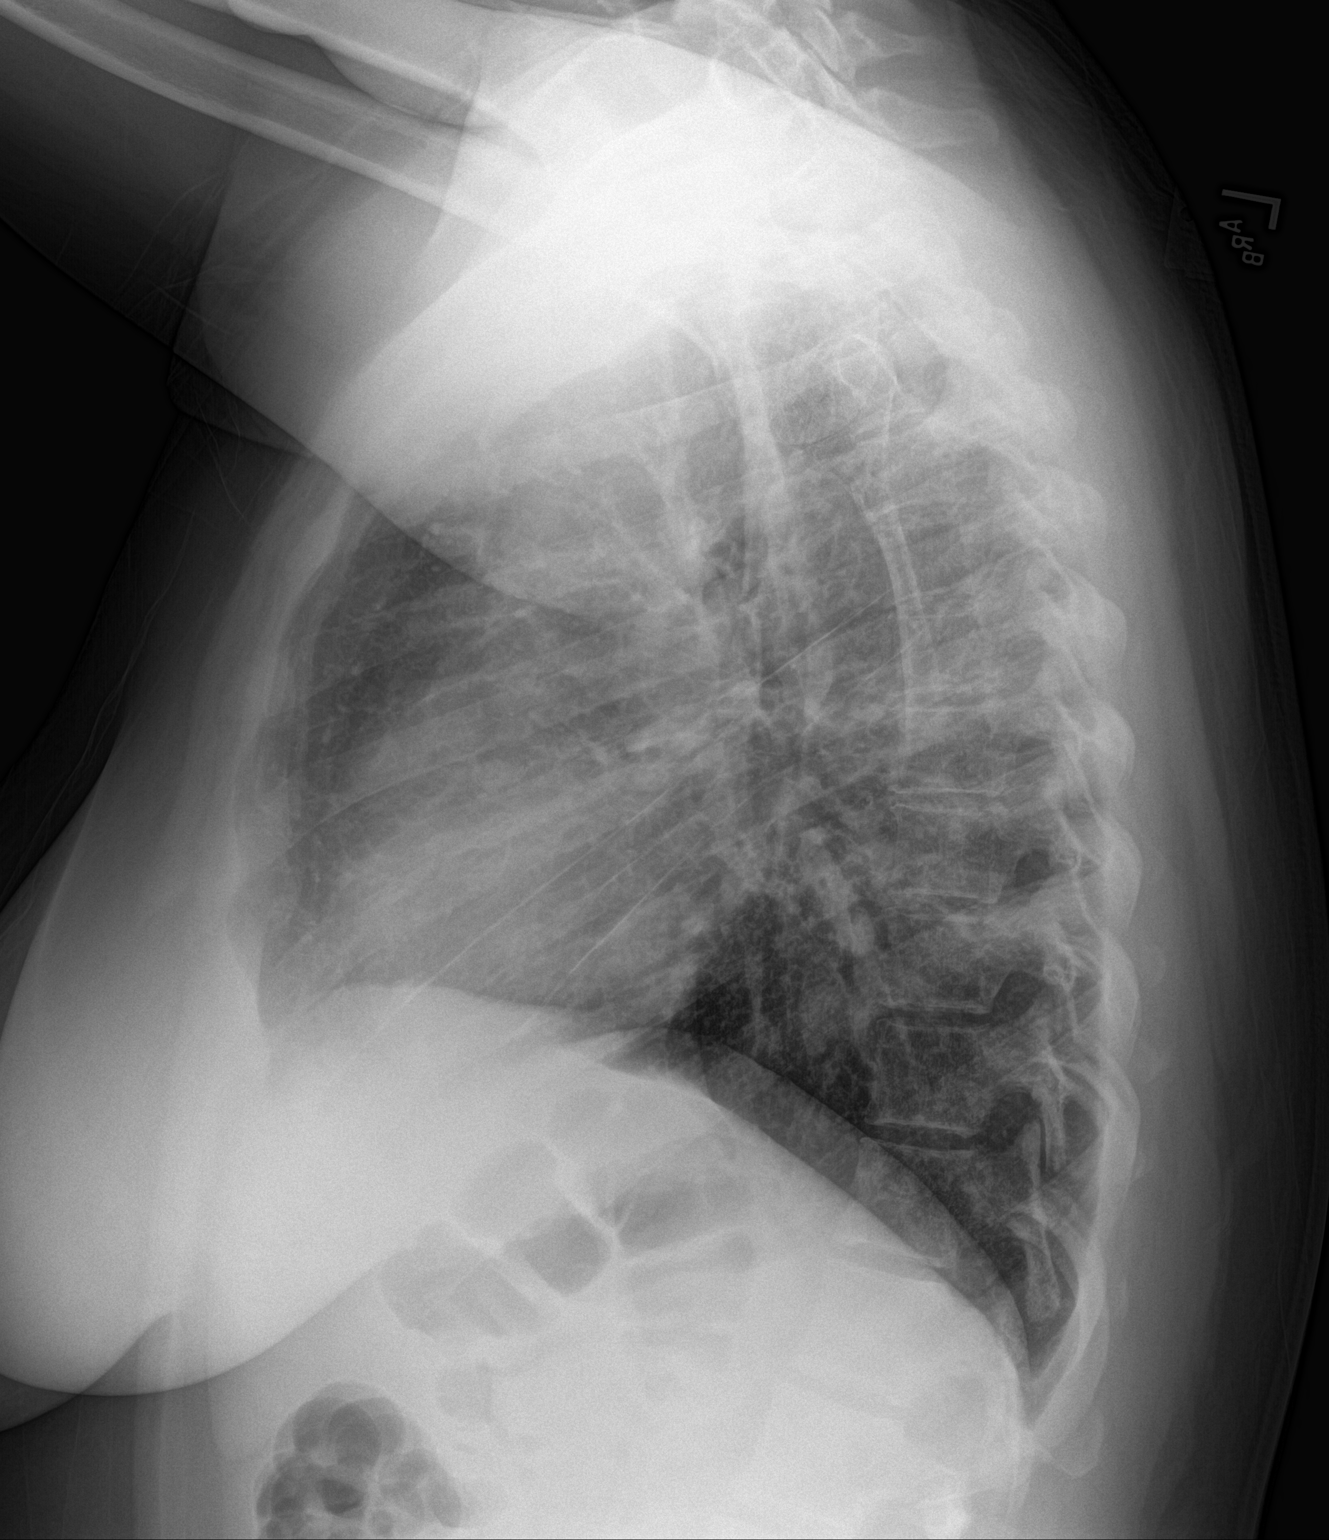

[2 of 2 positions shown; findings below may reference images not displayed]

FINDINGS: Cardiomediastinal silhouette is unremarkable. No acute infiltrate or
pulmonary edema. Perihilar and infrahilar bronchitic changes. Bony
thorax is unremarkable.
IMPRESSION: No infiltrate or pulmonary edema. Perihilar infrahilar bronchitic
changes.

## 2018-01-01 ENCOUNTER — Ambulatory Visit: Payer: Self-pay | Admitting: Surgery

## 2018-01-02 ENCOUNTER — Encounter: Payer: Self-pay | Admitting: Surgery

## 2018-01-02 ENCOUNTER — Telehealth: Payer: Self-pay | Admitting: Surgery

## 2018-01-02 NOTE — Telephone Encounter (Signed)
Patient no showed appointment on 01/01/18 with Dr. Everlene FarrierPabon, I have mailed a letter for the patient to contact our office and r/s no showed appointment.please r/s if patient calls.

## 2018-08-05 ENCOUNTER — Encounter: Payer: Self-pay | Admitting: Emergency Medicine

## 2018-08-05 ENCOUNTER — Emergency Department
Admission: EM | Admit: 2018-08-05 | Discharge: 2018-08-05 | Disposition: A | Discharge status: Home / Self Care | Payer: Medicaid Other | Attending: Emergency Medicine | Admitting: Emergency Medicine

## 2018-08-05 ENCOUNTER — Other Ambulatory Visit: Payer: Self-pay

## 2018-08-05 DIAGNOSIS — Z79899 Other long term (current) drug therapy: Secondary | ICD-10-CM | POA: Insufficient documentation

## 2018-08-05 DIAGNOSIS — J029 Acute pharyngitis, unspecified: Secondary | ICD-10-CM | POA: Insufficient documentation

## 2018-08-05 DIAGNOSIS — F17210 Nicotine dependence, cigarettes, uncomplicated: Secondary | ICD-10-CM | POA: Insufficient documentation

## 2018-08-05 MED ORDER — PENICILLIN G BENZATHINE 1200000 UNIT/2ML IM SUSP
1.2000 10*6.[IU] | Freq: Once | INTRAMUSCULAR | Status: AC
  Administered 2018-08-05: 1.2 10*6.[IU] via INTRAMUSCULAR
  Filled 2018-08-05 (×2): qty 2

## 2018-08-05 MED ORDER — PENICILLIN G BENZATHINE 1200000 UNIT/2ML IM SUSP
INTRAMUSCULAR | Status: AC
  Filled 2018-08-05: qty 2

## 2018-08-05 NOTE — ED Provider Notes (Signed)
[facility] Emergency Department Provider Note  ____________________________________________  Time seen: Approximately 7:19 PM  I have reviewed the triage vital signs and the nursing notes.   HISTORY  Chief Complaint Sore Throat    HPI Gabriella Lane is a 37 y.o. female who presents to the emergency department for treatment and evaluation of sore throat.  Patient states that she has recurrent strep throat and is working with her primary care provider for referral to ENT for tonsillectomy.  Patient denies other symptoms such as cough, nausea, abdominal pain, or rhinorrhea.  Symptoms started yesterday and are identical to previous confirmed cases of group A strep.   Past Medical History:  Diagnosis Date  . Hemorrhoid     Patient Active Problem List   Diagnosis Date Noted  . Breast pain 11/09/2015    Past Surgical History:  Procedure Laterality Date  . TUBAL LIGATION      Prior to Admission medications   Medication Sig Start Date End Date Taking? Authorizing Provider  albuterol (PROVENTIL HFA;VENTOLIN HFA) 108 (90 Base) MCG/ACT inhaler Inhale 2 puffs into the lungs every 6 (six) hours as needed for wheezing or shortness of breath. 08/12/16   Tommi RumpsSummers, Rhonda L, PA-C  docusate sodium (COLACE) 100 MG capsule Take 1 capsule (100 mg total) by mouth daily as needed. 11/21/17 11/21/18  Cuthriell, Delorise RoyalsJonathan D, PA-C  fluticasone (FLONASE) 50 MCG/ACT nasal spray Place 2 sprays into both nostrils daily. 06/25/17 06/25/18  Tommi RumpsSummers, Rhonda L, PA-C    Allergies Patient has no known allergies.  No family history on file.  Social History Social History   Tobacco Use  . Smoking status: Current Every Day Smoker    Packs/day: 1.00    Types: Cigarettes  . Smokeless tobacco: Never Used  Substance Use Topics  . Alcohol use: No  . Drug use: No    Review of Systems Constitutional: Negative for fever. Eyes: No visual changes. ENT: Positive for sore throat; negative for  difficulty swallowing. Respiratory: Denies shortness of breath. Gastrointestinal: Negative for abdominal pain.  No nausea, no vomiting.  No diarrhea.  Genitourinary: Negative for dysuria.  Negative for decrease in need to void. Musculoskeletal: Negative for generalized body aches. Skin: Negative for rash. Neurological: Negative for headaches, negative for focal weakness or numbness.  ____________________________________________   PHYSICAL EXAM:  VITAL SIGNS: ED Triage Vitals [08/05/18 1840]  Enc Vitals Group     BP      Pulse      Resp      Temp      Temp src      SpO2      Weight 195 lb 1.7 oz (88.5 kg)     Height      Head Circumference      Peak Flow      Pain Score 8     Pain Loc      Pain Edu?      Excl. in GC?     Constitutional: Alert and oriented. Well appearing and in no acute distress. Eyes: Conjunctivae are normal.  Head: Atraumatic. Nose: No congestion/rhinnorhea. Mouth/Throat: Mucous membranes are moist.  Oropharynx erythematous, tonsils 1+ with exudate. Uvula is midline. Ears: Right tympanic membrane appears normal. Left tympanic membrane appears normal. Neck: No stridor. Voice clear Lymphatic: Anterior cervical nodes tender. Cardiovascular: Normal rate, regular rhythm. Good peripheral circulation. Respiratory: Normal respiratory effort. Lungs CTAB. Gastrointestinal: Soft and nontender. Musculoskeletal: FROM of neck, upper and lower extremities. Neurologic:  Normal speech and language.  No gross focal neurologic deficits are appreciated. Skin:  Skin is warm, dry and intact. No rash noted Psychiatric: Mood and affect are normal. Speech and behavior are normal.  ____________________________________________   LABS (all labs ordered are listed, but only abnormal results are displayed)  Labs Reviewed - No data to display ____________________________________________  EKG  Not indiated ____________________________________________  RADIOLOGY  Not  indicated. ____________________________________________   PROCEDURES  Procedure(s) performed: None  Critical Care performed: No ____________________________________________   INITIAL IMPRESSION / ASSESSMENT AND PLAN / ED COURSE  37 year old female presenting to the emergency department for treatment and evaluation of symptoms and exam consistent with strep throat.  Patient requests the injection and states that it works well for her.  She is to follow-up with her primary care provider to inquire about the referral to ENT.  She states that the referral is required from her PCP.  She was instructed to return to the emergency department for symptoms of change or worsen if unable to schedule an appointment.  Pertinent labs & imaging results that were available during my care of the patient were reviewed by me and considered in my medical decision making (see chart for details). ____________________________________________  New Prescriptions   No medications on file    FINAL CLINICAL IMPRESSION(S) / ED DIAGNOSES  Final diagnoses:  Exudative pharyngitis    If controlled substance prescribed during this visit, 12 month history viewed on the [facility] prior to issuing an initial prescription for Schedule II or III opiod.   Note:  This document was prepared using Dragon voice recognition software and may include unintentional dictation errors.    Chinita Pester, FNP 08/05/18 1930    Minna Antis, MD 08/05/18 [phone]

## 2018-08-05 NOTE — ED Triage Notes (Signed)
Sore throat x 1 day.  States she sees white spots to tonsills.

## 2018-08-05 NOTE — Discharge Instructions (Signed)
Please follow-up with primary care.  See the ENT doctor when possible.  Return to the emergency department for symptoms of change or worsen if you are unable to schedule an appointment with the specialist with a primary care provider.

## 2018-10-01 DIAGNOSIS — N879 Dysplasia of cervix uteri, unspecified: Secondary | ICD-10-CM | POA: Insufficient documentation

## 2019-05-06 ENCOUNTER — Other Ambulatory Visit: Payer: Self-pay

## 2019-05-06 DIAGNOSIS — Z20822 Contact with and (suspected) exposure to covid-19: Secondary | ICD-10-CM

## 2019-05-07 LAB — NOVEL CORONAVIRUS, NAA: SARS-CoV-2, NAA: NOT DETECTED

## 2019-07-29 ENCOUNTER — Emergency Department: Payer: Medicaid Other

## 2019-07-29 ENCOUNTER — Other Ambulatory Visit: Payer: Self-pay

## 2019-07-29 ENCOUNTER — Encounter: Payer: Self-pay | Admitting: Emergency Medicine

## 2019-07-29 ENCOUNTER — Emergency Department
Admission: EM | Admit: 2019-07-29 | Discharge: 2019-07-29 | Disposition: A | Discharge status: Home / Self Care | Payer: Medicaid Other | Attending: Emergency Medicine | Admitting: Emergency Medicine

## 2019-07-29 DIAGNOSIS — F17210 Nicotine dependence, cigarettes, uncomplicated: Secondary | ICD-10-CM | POA: Insufficient documentation

## 2019-07-29 DIAGNOSIS — N611 Abscess of the breast and nipple: Secondary | ICD-10-CM | POA: Insufficient documentation

## 2019-07-29 DIAGNOSIS — L0291 Cutaneous abscess, unspecified: Secondary | ICD-10-CM

## 2019-07-29 DIAGNOSIS — N6019 Diffuse cystic mastopathy of unspecified breast: Secondary | ICD-10-CM

## 2019-07-29 HISTORY — PX: BREAST CYST ASPIRATION: SHX578

## 2019-07-29 HISTORY — DX: Diffuse cystic mastopathy of unspecified breast: N60.19

## 2019-07-29 MED ORDER — SULFAMETHOXAZOLE-TRIMETHOPRIM 800-160 MG PO TABS: 1.0000 | ORAL_TABLET | Freq: Two times a day (BID) | ORAL | 0 refills | Status: AC

## 2019-07-29 MED ORDER — CEPHALEXIN 500 MG PO CAPS: 500.0000 mg | ORAL_CAPSULE | Freq: Four times a day (QID) | ORAL | 0 refills | Status: DC

## 2019-07-29 MED ORDER — OXYCODONE-ACETAMINOPHEN 5-325 MG PO TABS
1.0000 | ORAL_TABLET | Freq: Once | ORAL | Status: AC
  Administered 2019-07-29: 15:00:00 1 via ORAL
  Filled 2019-07-29: qty 1

## 2019-07-29 MED ORDER — SULFAMETHOXAZOLE-TRIMETHOPRIM 800-160 MG PO TABS: 1.0000 | ORAL_TABLET | Freq: Two times a day (BID) | ORAL | 0 refills | Status: DC

## 2019-07-29 MED ORDER — CEPHALEXIN 500 MG PO CAPS: 500.0000 mg | ORAL_CAPSULE | Freq: Four times a day (QID) | ORAL | 0 refills | Status: AC

## 2019-07-29 MED ORDER — OXYCODONE-ACETAMINOPHEN 5-325 MG PO TABS
1.0000 | ORAL_TABLET | Freq: Once | ORAL | Status: AC
  Administered 2019-07-29: 09:00:00 1 via ORAL
  Filled 2019-07-29: qty 1

## 2019-07-29 MED ORDER — OXYCODONE-ACETAMINOPHEN 5-325 MG PO TABS: 1.0000 | ORAL_TABLET | ORAL | 0 refills | Status: DC | PRN

## 2019-07-29 NOTE — ED Notes (Signed)
PAtient taken to [place]

## 2019-07-29 NOTE — ED Triage Notes (Signed)
Pt in via POV, reports chronic mastitis with flare up beginning 3 days ago, reports pain, swelling, redness to left breast.  NAD noted at this time.

## 2019-07-29 NOTE — ED Provider Notes (Signed)
[facility] Emergency Department Provider Note   ____________________________________________   First MD Initiated Contact with Patient 07/29/19 [phone]     (approximate)  I have reviewed the triage vital signs and the nursing notes.   HISTORY  Chief Complaint Mastitis    HPI Gabriella Lane is a 38 y.o. female with past medical history of chronic mastitis who presents to the ED complaining of breast pain and swelling.  Patient reports she has had 3 days of worsening pain and swelling to her left breast.  She will occasionally deal with mild pain and swelling in either breast related to her chronic mastitis, but she states current symptoms are worse and she is concerned she might have an abscess.  She has noticed a "softball" underneath her left areola, but has not noticed any drainage from her nipple.  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  She denies any fevers, chills, chest pain, shortness of breath, nausea, or vomiting.  In the past she has followed with a breast specialist at [facility] and has required needle aspiration of abscesses, but she states she has not had this issue in multiple years.        Past Medical History:  Diagnosis Date  . Hemorrhoid   . Mastitis chronic     Patient Active Problem List   Diagnosis Date Noted  . Breast pain 11/09/2015    Past Surgical History:  Procedure Laterality Date  . TUBAL LIGATION      Prior to Admission medications   Medication Sig Start Date End Date Taking? Authorizing Provider  cephALEXin (KEFLEX) 500 MG capsule Take 1 capsule (500 mg total) by mouth 4 (four) times daily for 7 days. 07/29/19 08/05/19  Blake Divine, MD  oxyCODONE-acetaminophen (PERCOCET) 5-325 MG tablet Take 1 tablet by mouth every 4 (four) hours as needed for severe pain. 07/29/19 07/28/20  Blake Divine, MD   sulfamethoxazole-trimethoprim (BACTRIM DS) 800-160 MG tablet Take 1 tablet by mouth 2 (two) times daily for 7 days. 07/29/19 08/05/19  Blake Divine, MD    Allergies Patient has no known allergies.  No family history on file.  Social History Social History   Tobacco Use  . Smoking status: Current Every Day Smoker    Packs/day: 1.00    Types: Cigarettes  . Smokeless tobacco: Never Used  Substance Use Topics  . Alcohol use: No  . Drug use: No    Review of Systems  Constitutional: No fever/chills Eyes: No visual changes. ENT: No sore throat. Cardiovascular: Denies chest pain. Respiratory: Denies shortness of breath. Gastrointestinal: No abdominal pain.  No nausea, no vomiting.  No diarrhea.  No constipation. Genitourinary: Negative for dysuria. Musculoskeletal: Negative for back pain. Skin: Negative for rash.  Positive for breast pain and swelling. Neurological: Negative for headaches, focal weakness or numbness.  ____________________________________________   PHYSICAL EXAM:  VITAL SIGNS: ED Triage Vitals  Enc Vitals Group     BP 07/29/19 0746 136/73     Pulse Rate 07/29/19 0746 89     Resp 07/29/19 0746 16     Temp 07/29/19 0746 98.7 F (37.1 C)     Temp Source 07/29/19 0746 Oral     SpO2 07/29/19 0746 100 %     Weight 07/29/19 0747 210 lb (95.3 kg)     Height 07/29/19 0747 5\' 2"  (1.575 m)     Head Circumference --  Peak Flow --      Pain Score 07/29/19 0746 6     Pain Loc --      Pain Edu? --      Excl. in GC? --     Constitutional: Alert and oriented. Eyes: Conjunctivae are normal. Head: Atraumatic. Nose: No congestion/rhinnorhea. Mouth/Throat: Mucous membranes are moist. Neck: Normal ROM Cardiovascular: Normal rate, regular rhythm. Grossly normal heart sounds. Respiratory: Normal respiratory effort.  No retractions. Lungs CTAB. Gastrointestinal: Soft and nontender. No distention. Genitourinary: Erythema, edema, and warmth to area surrounding  left areola, no drainage noted from nipple.  She does have significant area of fluctuance beneath the left areola with tenderness to palpation. Musculoskeletal: No lower extremity tenderness nor edema. Neurologic:  Normal speech and language. No gross focal neurologic deficits are appreciated. Skin:  Skin is warm, dry and intact. No rash noted. Psychiatric: Mood and affect are normal. Speech and behavior are normal.  ____________________________________________   LABS (all labs ordered are listed, but only abnormal results are displayed)  Labs Reviewed  AEROBIC/ANAEROBIC CULTURE (SURGICAL/DEEP WOUND)    PROCEDURES  Procedure(s) performed (including Critical Care):  Procedures   ____________________________________________   INITIAL IMPRESSION / ASSESSMENT AND PLAN / ED COURSE       38 year old female with history of chronic mastitis presents to the ED with increased redness, swelling, and pain from her left breast.  This appears consistent with abscess on exam and ultrasound confirms cellulitis with complex fluid collection consistent with abscess.  She does not appear systemically ill, no evidence of sepsis as vital signs are reassuring.  Initial plan was for outpatient antibiotics and close follow-up with [facility], where she had previously been seen, however patient later stated that she had not been seen by them in many years and is unsure whether she could obtain follow-up.  Case was then discussed with general surgery, who recommended involving IR for potential aspiration.  IR was able to arrange for needle aspiration and drainage, after which the plan will be for patient to follow-up with general surgery.  Will start Keflex and Bactrim, counseled to return to the ED for new or worsening symptoms.  Patient tolerated IR drainage without difficulty, will treat ongoing pain with Percocet.  Prescriptions for Percocet, Bactrim, and Keflex sent to patient's pharmacy.  Counseled her to  follow-up with general surgery as scheduled and to return to the ED for new or worsening symptoms.  Patient agrees with plan.      ____________________________________________   FINAL CLINICAL IMPRESSION(S) / ED DIAGNOSES  Final diagnoses:  Abscess  Breast abscess  Chronic mastitis, unspecified laterality     ED Discharge Orders         Ordered    cephALEXin (KEFLEX) 500 MG capsule  4 times daily,   Status:  Discontinued     07/29/19 1231    sulfamethoxazole-trimethoprim (BACTRIM DS) 800-160 MG tablet  2 times daily,   Status:  Discontinued     07/29/19 1231    cephALEXin (KEFLEX) 500 MG capsule  4 times daily     07/29/19 1232    sulfamethoxazole-trimethoprim (BACTRIM DS) 800-160 MG tablet  2 times daily     07/29/19 1232    oxyCODONE-acetaminophen (PERCOCET) 5-325 MG tablet  Every 4 hours PRN     07/29/19 1232           Note:  This document was prepared using Dragon voice recognition software and may include unintentional dictation errors.   Chesley Noon, MD 07/29/19 1452

## 2019-07-29 NOTE — ED Notes (Signed)
Spoke with Abby in [place]. She is going to call radiologist to read report

## 2019-08-04 ENCOUNTER — Other Ambulatory Visit: Payer: Self-pay

## 2019-08-04 ENCOUNTER — Encounter: Payer: Self-pay | Admitting: Surgery

## 2019-08-04 ENCOUNTER — Ambulatory Visit (INDEPENDENT_AMBULATORY_CARE_PROVIDER_SITE_OTHER): Payer: Self-pay | Admitting: Surgery

## 2019-08-04 VITALS — BP 134/83 | HR 87 | Temp 98.8°F | Resp 12 | Ht 62.0 in | Wt 207.4 lb

## 2019-08-04 DIAGNOSIS — N611 Abscess of the breast and nipple: Secondary | ICD-10-CM | POA: Insufficient documentation

## 2019-08-04 DIAGNOSIS — N644 Mastodynia: Secondary | ICD-10-CM

## 2019-08-04 LAB — AEROBIC/ANAEROBIC CULTURE (SURGICAL/DEEP WOUND): Special Requests: NORMAL

## 2019-08-04 LAB — AEROBIC/ANAEROBIC CULTURE W GRAM STAIN (SURGICAL/DEEP WOUND)

## 2019-08-04 MED ORDER — CLINDAMYCIN HCL 300 MG PO CAPS: 300.0000 mg | ORAL_CAPSULE | Freq: Three times a day (TID) | ORAL | 0 refills | Status: DC

## 2019-08-04 MED ORDER — SULFAMETHOXAZOLE-TRIMETHOPRIM 800-160 MG PO TABS: 1.0000 | ORAL_TABLET | Freq: Two times a day (BID) | ORAL | 0 refills | Status: DC

## 2019-08-04 NOTE — Patient Instructions (Addendum)
Please call if you have questions or concerns.  Once we get the images from [facility] imaging we can schedule the mammogram. Someone from [facility] [facility] will call to schedule the mammogram. If you have not heard from anyone within 7-10 days please call me and let me know.      Please pick up your medication at the pharmacy.

## 2019-08-04 NOTE — Progress Notes (Signed)
Patient ID: Gabriella Lane, female   DOB: 1981/01/13, 38 y.o.   MRN: [id number]  Chief Complaint:   History of Present Illness Gabriella Lane is a 38 y.o. female with a long history of recurring mastitis with multiple episodes of abscess requiring aspiration.  She has had abscesses surface and drained spontaneously, she has never required incision and drainage.  She has never had a catheter placed for prolonged drainage.  She underwent aspiration 6 days ago upon presentation to the ED for left breast pain tenderness and swelling.  3 mL of purulent material was aspirated and cultures obtained.  Since that time she has been on Bactrim and Keflex and has progressed well with tenderness trending toward baseline.  She denies fevers and chills, she has no redness per her report and no drainage from known sinus tracts or nipples. She was followed by an expert at [facility] for years but has not seen this physician in years as well.  Recommended follow-up imaging was a mammogram and repeat ultrasound in 1 week.  She continues to smoke.  Past Medical History Past Medical History:  Diagnosis Date  . Hemorrhoid   . Mastitis chronic       Past Surgical History:  Procedure Laterality Date  . BREAST BIOPSY Left   . BREAST CYST ASPIRATION    . TUBAL LIGATION      No Known Allergies  Current Outpatient Medications  Medication Sig Dispense Refill  . cephALEXin (KEFLEX) 500 MG capsule Take 1 capsule (500 mg total) by mouth 4 (four) times daily for 7 days. 28 capsule 0  . oxyCODONE-acetaminophen (PERCOCET) 5-325 MG tablet Take 1 tablet by mouth every 4 (four) hours as needed for severe pain. 12 tablet 0  . sulfamethoxazole-trimethoprim (BACTRIM DS) 800-160 MG tablet Take 1 tablet by mouth 2 (two) times daily for 7 days. 14 tablet 0  . clindamycin (CLEOCIN) 300 MG capsule Take 1 capsule (300 mg total) by mouth 3 (three) times daily. 14 capsule 0  . sulfamethoxazole-trimethoprim (BACTRIM DS) 800-160 MG tablet Take 1  tablet by mouth 2 (two) times daily. 28 tablet 0   No current facility-administered medications for this visit.     Family History Family History  Problem Relation Age of Onset  . Hypertension Mother   . COPD Mother       Social History Social History   Tobacco Use  . Smoking status: Current Every Day Smoker    Packs/day: 1.00    Types: Cigarettes  . Smokeless tobacco: Never Used  Substance Use Topics  . Alcohol use: No  . Drug use: Yes    Types: Marijuana        Review of Systems  Constitutional: Negative for chills and fever.  HENT: Negative.   Eyes: Negative.   Respiratory: Negative.   Cardiovascular: Negative.   Gastrointestinal: Negative.   Genitourinary: Negative.   Musculoskeletal: Negative.   Skin: Negative for itching and rash.  Endo/Heme/Allergies: Negative.       Physical Exam Blood pressure 134/83, pulse 87, temperature 98.8 F (37.1 C), temperature source Temporal, resp. rate 12, height 5\' 2"  (1.575 m), weight 207 lb 6.4 oz (94.1 kg), SpO2 98 %.  CONSTITUTIONAL: Well-nourished and developed female in no acute distress. EYES: Sclera non-icteric.   EARS, NOSE, MOUTH AND THROAT: The oropharynx is clear. Oral mucosa is pink and moist. Hearing is intact to voice.  NECK: Trachea is midline, and there is no jugular venous distension.  LYMPH NODES:  Lymph nodes  in the neck are not enlarged. RESPIRATORY:  Lungs are clear, and breath sounds are equal bilaterally. Normal respiratory effort without pathologic use of accessory muscles. CARDIOVASCULAR: Heart is regular in rate and rhythm. GI: The abdomen is  soft, nontender, and nondistended.  GU: Breast exam shows no significant dimpling or dermal scarring.  There is a punctate sinus tracts of bilateral breasts, the left is at the areolar margin approximately 10:00.  There is a subareolar cystic mass effect on the left.  No appreciable tenderness, fluctuance or induration.  No other suspicious or dominant masses  are appreciated in either breast.  Mildly tender as expected without evidence of active inflammatory or infectious process.  The left everted nipple is slightly irregular compared with the right, neither have active nipple discharge. MUSCULOSKELETAL:  Symmetrical muscle tone appreciated in all four extremities.    SKIN: Skin turgor is normal. No pathologic skin lesions appreciated.  NEUROLOGIC:  Motor and sensation appear grossly normal.  Cranial nerves are grossly intact. PSYCH:  Alert and oriented to person, place and time. Affect is appropriate for situation.  Data Reviewed I have personally reviewed the patient's imaging and medical records.   I have also reviewed the microbiology reports from the aspirations completed.  Plan was to repeat mammography and ultrasound within a week.  Assessment    Chronic mastitis with recent left breast abscess status post satisfactory drainage via aspiration.  Plan    Complete her current antibiotic course, in lieu of Keflex I will add clindamycin for 2-week course.  She will continue Bactrim for the same interval.  We will make arrangements to have her imaging completed and we will have her follow-up in 10 days or more.  Face-to-face time spent with the patient and care providers was 45 minutes, with more than 50% of the time spent counseling, educating, and coordinating care of the patient.      Campbell Lerner 08/04/2019, 9:39 AM

## 2019-08-05 NOTE — Progress Notes (Signed)
Brief Pharmacy Note  Patient is a 38 y/o F with history of chronic mastitis who presented to [facility] ED 11/11 c/o breast pain and swelling. Exam and ultrasound consistent with abscess. IR aspiration and drainage of fluid collection performed in ED. Patient was discharged on SMX/TMP and cephalexin. Patient f/u with surgery 11/17 and reported improvement of tenderness. At this time, she was continued on SMX/TMP and cephalexin was changed to clindamycin with planned antibiotic combination duration of 2 weeks.   Aspiration fluid culture from ED visit has resulted moderate Actinotignum schaalii and moderate Prevotella disiens. No susceptibilities reported although culture was reported to be beta-lactamase negative. Per literature, clindamycin has activity versus both organisms. SMX/TMP has activity for some Actinotignum schaalii isolates. Empiric coverage of these organisms with these antibiotics is appropriate and does not warrant change at this time although resistance cannot be ruled out without sensitivities. She is to f/u with surgery per chart on 08/20/2019. No further action indicated.  [place] Resident 05 August 2019

## 2019-08-10 ENCOUNTER — Encounter: Payer: Self-pay | Admitting: Surgery

## 2019-08-11 ENCOUNTER — Inpatient Hospital Stay
Admission: RE | Admit: 2019-08-11 | Discharge: 2019-08-11 | Disposition: A | Discharge status: Home / Self Care | Payer: Self-pay | Source: Ambulatory Visit | Attending: *Deleted | Admitting: *Deleted

## 2019-08-11 ENCOUNTER — Other Ambulatory Visit: Payer: Self-pay | Admitting: *Deleted

## 2019-08-11 DIAGNOSIS — Z1231 Encounter for screening mammogram for malignant neoplasm of breast: Secondary | ICD-10-CM

## 2019-08-12 ENCOUNTER — Telehealth: Payer: Self-pay

## 2019-08-12 NOTE — Telephone Encounter (Signed)
Mammogram appointment 08/21/2019 @ 10 am. Dr.Rodenberg 12/8/ @ 9:15 am. Patient verbalized understanding.

## 2019-08-20 ENCOUNTER — Ambulatory Visit: Payer: Self-pay | Admitting: Surgery

## 2019-08-21 ENCOUNTER — Ambulatory Visit
Admission: RE | Admit: 2019-08-21 | Discharge: 2019-08-21 | Disposition: A | Discharge status: Home / Self Care | Payer: Self-pay | Source: Ambulatory Visit | Attending: Surgery | Admitting: Surgery

## 2019-08-21 ENCOUNTER — Other Ambulatory Visit: Payer: Self-pay | Admitting: Surgery

## 2019-08-21 ENCOUNTER — Other Ambulatory Visit: Payer: Self-pay

## 2019-08-21 ENCOUNTER — Telehealth: Payer: Self-pay | Admitting: *Deleted

## 2019-08-21 DIAGNOSIS — N644 Mastodynia: Secondary | ICD-10-CM

## 2019-08-21 DIAGNOSIS — R928 Other abnormal and inconclusive findings on diagnostic imaging of breast: Secondary | ICD-10-CM | POA: Insufficient documentation

## 2019-08-21 NOTE — Telephone Encounter (Signed)
Patient called and stated that she had a breast aspiration today at [facility] and she wants to know if she can get some pain medication. Please call and advise

## 2019-08-21 NOTE — Telephone Encounter (Signed)
Spoke with patient she states that [place] recommend me she speak with Dr.Rodenberg about being prescribed pain medication. Patient states icing the area, tylenol or ibuprofen does not help her with the pain or discomfort. I informed patient I would speak with the provider and reach back out to her with his recommendations by Monday. Patient verbalized understanding.

## 2019-08-24 ENCOUNTER — Other Ambulatory Visit: Payer: Self-pay

## 2019-08-24 ENCOUNTER — Other Ambulatory Visit: Payer: Self-pay | Admitting: Surgery

## 2019-08-24 DIAGNOSIS — R928 Other abnormal and inconclusive findings on diagnostic imaging of breast: Secondary | ICD-10-CM

## 2019-08-24 DIAGNOSIS — N611 Abscess of the breast and nipple: Secondary | ICD-10-CM

## 2019-08-24 DIAGNOSIS — Z20822 Contact with and (suspected) exposure to covid-19: Secondary | ICD-10-CM

## 2019-08-25 ENCOUNTER — Ambulatory Visit: Payer: Self-pay | Admitting: Surgery

## 2019-08-26 LAB — NOVEL CORONAVIRUS, NAA: SARS-CoV-2, NAA: NOT DETECTED

## 2019-08-27 ENCOUNTER — Ambulatory Visit: Payer: Self-pay | Admitting: Surgery

## 2019-09-03 ENCOUNTER — Encounter: Payer: Self-pay | Admitting: Surgery

## 2019-09-03 ENCOUNTER — Other Ambulatory Visit: Payer: Self-pay

## 2019-09-03 ENCOUNTER — Ambulatory Visit (INDEPENDENT_AMBULATORY_CARE_PROVIDER_SITE_OTHER): Payer: Self-pay | Admitting: Surgery

## 2019-09-03 VITALS — BP 115/83 | HR 106 | Temp 97.3°F | Ht 63.0 in | Wt 208.6 lb

## 2019-09-03 DIAGNOSIS — N611 Abscess of the breast and nipple: Secondary | ICD-10-CM

## 2019-09-03 NOTE — Patient Instructions (Addendum)
Continue your antibiotics. Continue warm compresses to the area. Try to keep the skin open in the area. We will look into additional therapy to help with this problem. Follow up here in one month.

## 2019-09-03 NOTE — Progress Notes (Signed)
[facility] NOTE  09/03/2019  History of Present Illness: Gabriella Lane is a 38 y.o. female who presents for follow-up of recurring left breast abscess.  She has a well-known chronic sinus which she will manipulate and self probe to obtain the drainage necessary to provide relief.  She has had recent aspiration under ultrasound guidance and is currently taking antibiotics of doxycycline.  She currently is doing well with residual subareolar nodule that is immediately adjacent to the skin at 11:00.  She has no erythema or or remarkable tenderness at this time.  Past Medical History: Past Medical History:  Diagnosis Date  . Hemorrhoid   . Mastitis chronic      Past Surgical History: Past Surgical History:  Procedure Laterality Date  . BREAST BIOPSY Left 2013   benign  . BREAST CYST ASPIRATION Left 07/29/2019   ABUNDANT WBC PRESENT, PREDOMINANTLY PMN   . TUBAL LIGATION      Home Medications: Prior to Admission medications   Medication Sig Start Date End Date Taking? Authorizing Provider  doxycycline (VIBRAMYCIN) 100 MG capsule Take 100 mg by mouth 2 (two) times daily.   Yes [provider]    Allergies: No Known Allergies  Review of Systems: Review of Systems  Constitutional: Negative for chills, fever and malaise/fatigue.  HENT: Negative.   Eyes: Negative.   Respiratory: Negative.   Cardiovascular: Negative.   Gastrointestinal: Negative.   Genitourinary: Negative.   Musculoskeletal: Negative.   Skin: Negative for itching and rash.  Neurological: Negative.   Endo/Heme/Allergies: Negative.     Physical Exam BP 115/83   Pulse (!) 106   Temp (!) 97.3 F (36.3 C)   Ht 5\' 3"  (1.6 m)   Wt 208 lb 9.6 oz (94.6 kg)   LMP 09/03/2019   SpO2 97%   BMI 36.95 kg/m   Physical Exam  Labs/Imaging: CLINICAL DATA:  38 year old female presenting for ultrasound-guided aspiration of a left breast abscess.  EXAM: ULTRASOUND GUIDED  LEFT BREAST CYST ASPIRATION  COMPARISON:  Previous exams.  PROCEDURE: Using sterile technique, 1% lidocaine, under direct ultrasound visualization, needle aspiration of a retroareolar left breast abscess at 11 o'clock was performed. Approximately 2 mL of purulent material was aspirated and discarded.  IMPRESSION: Ultrasound-guided aspiration of a left breast abscess. No apparent complications.  RECOMMENDATIONS: See recommendations from the diagnostic mammogram and ultrasound performed same day.   Electronically Signed   By: Ammie Ferrier M.D.   On: 08/21/2019 12:01  Assessment and Plan: This is a 38 y.o. female with recurrent left breast abscess, chronic cavitation and subareolar space with persistent sinus tract present.  We discussed options and maintenance involving maintenance of the external opening, which she understands the strategies present there.  We discussed obtaining a probe, and she discussed what she has used and how she is sterilized it in the past.  We discussed surgical options including a subareolar excision and the risks involved of loss of nipple sensation/nipple necrosis or scarring or changes in location or size of current fistulous tract.  I believe she currently would like to maintain with oral antibiotics repeated ultrasound imaging and as needed maintenance of her external opening to ensure/avoid reaccumulation of internal debris.  I believe this is been discussed thoroughly and she understands her disease process well and desires to defer any other surgical intervention at this time.  I will see her back in a month.  Face-to-face time spent with the patient and care providers was 20 minutes,  with more than 50% of the time spent counseling, educating, and coordinating care of the patient.     Campbell Lerner [facility] 09/03/2019, 9:42 AM

## 2019-10-01 ENCOUNTER — Ambulatory Visit: Payer: Medicaid Other | Admitting: Surgery

## 2019-10-08 ENCOUNTER — Ambulatory Visit: Payer: Medicaid Other | Admitting: Surgery

## 2019-10-15 ENCOUNTER — Other Ambulatory Visit: Payer: Self-pay

## 2019-10-15 ENCOUNTER — Encounter: Payer: Self-pay | Admitting: Surgery

## 2019-10-15 ENCOUNTER — Ambulatory Visit: Payer: Self-pay | Admitting: Surgery

## 2019-10-15 ENCOUNTER — Telehealth: Payer: Self-pay | Admitting: Surgery

## 2019-10-15 ENCOUNTER — Ambulatory Visit (INDEPENDENT_AMBULATORY_CARE_PROVIDER_SITE_OTHER): Payer: Medicaid Other | Admitting: Surgery

## 2019-10-15 VITALS — BP 115/80 | HR 73 | Temp 95.2°F | Resp 12 | Ht 63.0 in | Wt 208.0 lb

## 2019-10-15 DIAGNOSIS — N611 Abscess of the breast and nipple: Secondary | ICD-10-CM

## 2019-10-15 NOTE — H&P (View-Only) (Signed)
Patient ID: Gabriella Lane, female   DOB: 09/05/1981, 38 y.o.   MRN: [id number]  Chief Complaint: Persistent and chronic presence of subareolar breast cyst/abscess.  History of Present Illness Gabriella Lane is a 38 y.o. female with a multiyear history of recurring left breast abscesses, treatment with antibiotics and aspirations.  Now with persisting sinus tract within the areola.  She repeatedly is able to milk out semipurulent drainage, and has a chronic abscess cavity that is currently stable and fairly distinct.  She has deferred any surgical intervention on multiple visits, has maximized her antibiotic utilization.  And she is desirous of proceeding with something more definitive and is willing to take the risks involved.  Past Medical History Past Medical History:  Diagnosis Date  . Hemorrhoid   . Mastitis chronic       Past Surgical History:  Procedure Laterality Date  . BREAST BIOPSY Left 2013   benign  . BREAST CYST ASPIRATION Left 07/29/2019   ABUNDANT WBC PRESENT, PREDOMINANTLY PMN   . TUBAL LIGATION      No Known Allergies  Current Outpatient Medications  Medication Sig Dispense Refill  . acetaminophen (TYLENOL) 500 MG tablet Take 1,000 mg by mouth every 6 (six) hours as needed for moderate pain or headache.    . Aspirin-Caffeine (BC FAST PAIN RELIEF PO) Take 1 packet by mouth daily as needed (pain).    . oxymetazoline (AFRIN) 0.05 % nasal spray Place 1 spray into both nostrils 2 (two) times daily as needed for congestion.     No current facility-administered medications for this visit.    Family History Family History  Problem Relation Age of Onset  . Hypertension Mother   . COPD Mother   . Breast cancer Neg Hx       Social History Social History   Tobacco Use  . Smoking status: Current Every Day Smoker    Packs/day: 1.00    Types: Cigarettes  . Smokeless tobacco: Never Used  Substance Use Topics  . Alcohol use: No  . Drug use: Yes    Types: Marijuana         Review of Systems  Constitutional: Negative for chills and fever.  HENT: Negative.   Eyes: Negative.   Respiratory: Negative.   Cardiovascular: Negative.   Gastrointestinal: Negative.   Genitourinary: Negative.   Musculoskeletal: Negative.   Skin: Negative for itching and rash.  Neurological: Negative.   Endo/Heme/Allergies: Negative for polydipsia.      Physical Exam Blood pressure 115/80, pulse 73, temperature (!) 95.2 F (35.1 C), temperature source Temporal, resp. rate 12, height 5' 3" (1.6 m), weight 208 lb (94.3 kg), last menstrual period 10/01/2019, SpO2 97 %. Last Weight  Most recent update: 10/15/2019 10:33 AM   Weight  94.3 kg (208 lb)            CONSTITUTIONAL: Well developed, and nourished, appropriately responsive and aware without distress.   EYES: Sclera non-icteric.   EARS, NOSE, MOUTH AND THROAT: Mask worn.    Hearing is intact to voice.  NECK: Trachea is midline, and there is no jugular venous distension.  LYMPH NODES:  Lymph nodes in the neck are not enlarged.  I did not examine her left axilla for known prior lymphadenopathy based on ultrasound.  I suspect that these are primarily reactive and will reevaluate them once she has a definitive treatment of her inflammatory process in her left breast. RESPIRATORY:  Lungs are clear, and breath sounds are equal bilaterally. Normal   respiratory effort without pathologic use of accessory muscles. CARDIOVASCULAR: Heart is regular in rate and rhythm. GI: The abdomen is soft, nontender, and nondistended. There were no palpable masses. I did not appreciate hepatosplenomegaly. There were normal bowel sounds. GU: Left breast with well-defined 3 to 4 cm subcutaneous areolar mass at approximately 10:00.  With palpation there is evident drainage within a well defined os. MUSCULOSKELETAL:  Symmetrical muscle tone appreciated in all four extremities.    SKIN: Skin turgor is normal. No pathologic skin lesions appreciated.   NEUROLOGIC:  Motor and sensation appear grossly normal.  Cranial nerves are grossly without defect. PSYCH:  Alert and oriented to person, place and time. Affect is appropriate for situation.  Data Reviewed I have personally reviewed what is currently available of the patient's imaging, recent labs and medical records.   Labs:  CBC Latest Ref Rng & Units 08/12/2016  WBC 3.6 - 11.0 K/uL 17.1(H)  Hemoglobin 12.0 - 16.0 g/dL 35.7  Hematocrit 01.7 - 47.0 % 36.7  Platelets 150 - 440 K/uL 227   No flowsheet data found.     Assessment    Chronic left breast cyst/abscess. Patient Active Problem List   Diagnosis Date Noted  . Abscess of breast, left 08/04/2019  . Cervical dysplasia 10/01/2018  . Breast pain 11/09/2015    Plan    Excision of left breast mass. Risks of infection recurrence discussed in detail.  Risks of anesthesia bleeding also reviewed.  I believe she desires to proceed having her questions adequately answered.  No guarantees were ever expressed or implied.  Face-to-face time spent with the patient and accompanying care providers(if present) was 20 minutes, with more than 50% of the time spent counseling, educating, and coordinating care of the patient.      Campbell Lerner M.D., FACS 10/15/2019, 3:29 PM

## 2019-10-15 NOTE — Progress Notes (Signed)
Patient ID: Gabriella Lane, female   DOB: [birth year], 39 y.o.   MRN: [id number]  Chief Complaint: Persistent and chronic presence of subareolar breast cyst/abscess.  History of Present Illness Gabriella Lane is a 39 y.o. female with a multiyear history of recurring left breast abscesses, treatment with antibiotics and aspirations.  Now with persisting sinus tract within the areola.  She repeatedly is able to milk out semipurulent drainage, and has a chronic abscess cavity that is currently stable and fairly distinct.  She has deferred any surgical intervention on multiple visits, has maximized her antibiotic utilization.  And she is desirous of proceeding with something more definitive and is willing to take the risks involved.  Past Medical History Past Medical History:  Diagnosis Date  . Hemorrhoid   . Mastitis chronic       Past Surgical History:  Procedure Laterality Date  . BREAST BIOPSY Left 2013   benign  . BREAST CYST ASPIRATION Left 07/29/2019   ABUNDANT WBC PRESENT, PREDOMINANTLY PMN   . TUBAL LIGATION      No Known Allergies  Current Outpatient Medications  Medication Sig Dispense Refill  . acetaminophen (TYLENOL) 500 MG tablet Take 1,000 mg by mouth every 6 (six) hours as needed for moderate pain or headache.    . Aspirin-Caffeine (BC FAST PAIN RELIEF PO) Take 1 packet by mouth daily as needed (pain).    Marland Kitchen oxymetazoline (AFRIN) 0.05 % nasal spray Place 1 spray into both nostrils 2 (two) times daily as needed for congestion.     No current facility-administered medications for this visit.    Family History Family History  Problem Relation Age of Onset  . Hypertension Mother   . COPD Mother   . Breast cancer Neg Hx       Social History Social History   Tobacco Use  . Smoking status: Current Every Day Smoker    Packs/day: 1.00    Types: Cigarettes  . Smokeless tobacco: Never Used  Substance Use Topics  . Alcohol use: No  . Drug use: Yes    Types: Marijuana         Review of Systems  Constitutional: Negative for chills and fever.  HENT: Negative.   Eyes: Negative.   Respiratory: Negative.   Cardiovascular: Negative.   Gastrointestinal: Negative.   Genitourinary: Negative.   Musculoskeletal: Negative.   Skin: Negative for itching and rash.  Neurological: Negative.   Endo/Heme/Allergies: Negative for polydipsia.      Physical Exam Blood pressure 115/80, pulse 73, temperature (!) 95.2 F (35.1 C), temperature source Temporal, resp. rate 12, height 5\' 3"  (1.6 m), weight 208 lb (94.3 kg), last menstrual period 10/01/2019, SpO2 97 %. Last Weight  Most recent update: 10/15/2019 10:33 AM   Weight  94.3 kg (208 lb)            CONSTITUTIONAL: Well developed, and nourished, appropriately responsive and aware without distress.   EYES: Sclera non-icteric.   EARS, NOSE, MOUTH AND THROAT: Mask worn.    Hearing is intact to voice.  NECK: Trachea is midline, and there is no jugular venous distension.  LYMPH NODES:  Lymph nodes in the neck are not enlarged.  I did not examine her left axilla for known prior lymphadenopathy based on ultrasound.  I suspect that these are primarily reactive and will reevaluate them once she has a definitive treatment of her inflammatory process in her left breast. RESPIRATORY:  Lungs are clear, and breath sounds are equal bilaterally. Normal  respiratory effort without pathologic use of accessory muscles. CARDIOVASCULAR: Heart is regular in rate and rhythm. GI: The abdomen is soft, nontender, and nondistended. There were no palpable masses. I did not appreciate hepatosplenomegaly. There were normal bowel sounds. GU: Left breast with well-defined 3 to 4 cm subcutaneous areolar mass at approximately 10:00.  With palpation there is evident drainage within a well defined os. MUSCULOSKELETAL:  Symmetrical muscle tone appreciated in all four extremities.    SKIN: Skin turgor is normal. No pathologic skin lesions appreciated.   NEUROLOGIC:  Motor and sensation appear grossly normal.  Cranial nerves are grossly without defect. PSYCH:  Alert and oriented to person, place and time. Affect is appropriate for situation.  Data Reviewed I have personally reviewed what is currently available of the patient's imaging, recent labs and medical records.   Labs:  CBC Latest Ref Rng & Units 08/12/2016  WBC 3.6 - 11.0 K/uL 17.1(H)  Hemoglobin 12.0 - 16.0 g/dL 35.7  Hematocrit 01.7 - 47.0 % 36.7  Platelets 150 - 440 K/uL 227   No flowsheet data found.     Assessment    Chronic left breast cyst/abscess. Patient Active Problem List   Diagnosis Date Noted  . Abscess of breast, left 08/04/2019  . Cervical dysplasia 10/01/2018  . Breast pain 11/09/2015    Plan    Excision of left breast mass. Risks of infection recurrence discussed in detail.  Risks of anesthesia bleeding also reviewed.  I believe she desires to proceed having her questions adequately answered.  No guarantees were ever expressed or implied.  Face-to-face time spent with the patient and accompanying care providers(if present) was 20 minutes, with more than 50% of the time spent counseling, educating, and coordinating care of the patient.      Campbell Lerner M.D., FACS 10/15/2019, 3:29 PM

## 2019-10-15 NOTE — Telephone Encounter (Signed)
During the outbound call to the patient w/surgery information, she was curious to know how long to anticipate being out of work for recovery.  She is aware that her COVID testing date is 10/19/19, & that she should quarantine until sx on 10/21/19, however she is not sure how long to anticipate being out of work afterwards.  Please advise by calling back @ [phone].  Thank you

## 2019-10-15 NOTE — Telephone Encounter (Signed)
Pt has been advised of pre admission date/time, Covid Testing date and Surgery date.  Surgery Date: 10/21/19 Preadmission Testing Date: 10/21/19 (2 hrs prior to sx) Covid Testing Date: 10/19/19 - patient [facility] [facility])  Patient has been made aware to call [phone], between 1-3:00pm the day before surgery, to find out what time to arrive.

## 2019-10-15 NOTE — Patient Instructions (Addendum)
Our surgery scheduler Kennyth Arnold will contact you within the next 24-48 hours to discuss surgery dates, times and the prep for the surgery. Please have the BLUE sheet available when our scheduler Stacy contacts you.   If you have any questions or concerns, please give our office a call.

## 2019-10-19 ENCOUNTER — Other Ambulatory Visit
Admission: RE | Admit: 2019-10-19 | Discharge: 2019-10-19 | Disposition: A | Discharge status: Home / Self Care | Payer: Medicaid Other | Source: Ambulatory Visit | Attending: Surgery | Admitting: Surgery

## 2019-10-19 DIAGNOSIS — Z20822 Contact with and (suspected) exposure to covid-19: Secondary | ICD-10-CM | POA: Diagnosis not present

## 2019-10-19 DIAGNOSIS — Z01812 Encounter for preprocedural laboratory examination: Secondary | ICD-10-CM | POA: Insufficient documentation

## 2019-10-19 LAB — SARS CORONAVIRUS 2 (TAT 6-24 HRS): SARS Coronavirus 2: NEGATIVE

## 2019-10-20 MED ORDER — METRONIDAZOLE IN NACL 5-0.79 MG/ML-% IV SOLN
500.0000 mg | INTRAVENOUS | Status: AC
  Administered 2019-10-21: 500 mg via INTRAVENOUS
  Filled 2019-10-20: qty 100

## 2019-10-20 MED ORDER — CEFAZOLIN SODIUM-DEXTROSE 2-4 GM/100ML-% IV SOLN
2.0000 g | INTRAVENOUS | Status: AC
  Administered 2019-10-21: 2 g via INTRAVENOUS

## 2019-10-20 NOTE — Telephone Encounter (Signed)
Followed up with Dr.Rodenberg today 10/20/19. Per Dr.Rodenberg he spoke with patient Friday and went over necessary information with patient.

## 2019-10-21 ENCOUNTER — Ambulatory Visit
Admission: RE | Admit: 2019-10-21 | Discharge: 2019-10-21 | Disposition: A | Discharge status: Home / Self Care | Payer: Self-pay | Attending: Surgery | Admitting: Surgery

## 2019-10-21 ENCOUNTER — Ambulatory Visit: Payer: Self-pay | Admitting: Anesthesiology

## 2019-10-21 ENCOUNTER — Encounter
Admission: RE | Disposition: A | Discharge status: Home / Self Care | Payer: Self-pay | Source: Home / Self Care | Attending: Surgery

## 2019-10-21 ENCOUNTER — Encounter: Payer: Self-pay | Admitting: Surgery

## 2019-10-21 ENCOUNTER — Other Ambulatory Visit: Payer: Self-pay

## 2019-10-21 DIAGNOSIS — N611 Abscess of the breast and nipple: Secondary | ICD-10-CM | POA: Insufficient documentation

## 2019-10-21 DIAGNOSIS — F17210 Nicotine dependence, cigarettes, uncomplicated: Secondary | ICD-10-CM | POA: Insufficient documentation

## 2019-10-21 HISTORY — PX: BREAST CYST EXCISION: SHX579

## 2019-10-21 LAB — URINE DRUG SCREEN, QUALITATIVE (ARMC ONLY)
Amphetamines, Ur Screen: NOT DETECTED
Barbiturates, Ur Screen: NOT DETECTED
Benzodiazepine, Ur Scrn: NOT DETECTED
Cannabinoid 50 Ng, Ur ~~LOC~~: POSITIVE — AB
Cocaine Metabolite,Ur ~~LOC~~: NOT DETECTED
MDMA (Ecstasy)Ur Screen: NOT DETECTED
Methadone Scn, Ur: NOT DETECTED
Opiate, Ur Screen: NOT DETECTED
Phencyclidine (PCP) Ur S: NOT DETECTED
Tricyclic, Ur Screen: NOT DETECTED

## 2019-10-21 SURGERY — EXCISION, CYST, BREAST
Anesthesia: General | Site: Breast | Laterality: Left

## 2019-10-21 MED ORDER — ACETAMINOPHEN 500 MG PO TABS: 1000.0000 mg | ORAL_TABLET | ORAL | Status: AC

## 2019-10-21 MED ORDER — ONDANSETRON HCL 4 MG/2ML IJ SOLN
4.0000 mg | Freq: Once | INTRAMUSCULAR | Status: AC | PRN
  Administered 2019-10-21: 4 mg via INTRAVENOUS

## 2019-10-21 MED ORDER — PROPOFOL 10 MG/ML IV BOLUS
INTRAVENOUS | Status: AC
  Filled 2019-10-21: qty 40

## 2019-10-21 MED ORDER — GLYCOPYRROLATE 0.2 MG/ML IJ SOLN
INTRAMUSCULAR | Status: AC
  Filled 2019-10-21: qty 1

## 2019-10-21 MED ORDER — EPINEPHRINE PF 1 MG/ML IJ SOLN
INTRAMUSCULAR | Status: AC
  Filled 2019-10-21: qty 1

## 2019-10-21 MED ORDER — LIDOCAINE HCL (PF) 2 % IJ SOLN
INTRAMUSCULAR | Status: AC
  Filled 2019-10-21: qty 10

## 2019-10-21 MED ORDER — SEVOFLURANE IN SOLN
RESPIRATORY_TRACT | Status: AC
  Filled 2019-10-21: qty 250

## 2019-10-21 MED ORDER — BUPIVACAINE HCL (PF) 0.25 % IJ SOLN
INTRAMUSCULAR | Status: AC
  Filled 2019-10-21: qty 30

## 2019-10-21 MED ORDER — HYDROCODONE-ACETAMINOPHEN 7.5-325 MG PO TABS: 1.0000 | ORAL_TABLET | Freq: Once | ORAL | Status: DC

## 2019-10-21 MED ORDER — PROPOFOL 10 MG/ML IV BOLUS
INTRAVENOUS | Status: DC | PRN
  Administered 2019-10-21: 200 mg via INTRAVENOUS

## 2019-10-21 MED ORDER — ONDANSETRON HCL 4 MG/2ML IJ SOLN
INTRAMUSCULAR | Status: DC | PRN
  Administered 2019-10-21: 4 mg via INTRAVENOUS

## 2019-10-21 MED ORDER — ROCURONIUM BROMIDE 50 MG/5ML IV SOLN
INTRAVENOUS | Status: AC
  Filled 2019-10-21: qty 1

## 2019-10-21 MED ORDER — CHLORHEXIDINE GLUCONATE CLOTH 2 % EX PADS: 6.0000 | MEDICATED_PAD | Freq: Once | CUTANEOUS | Status: DC

## 2019-10-21 MED ORDER — LIDOCAINE HCL (CARDIAC) PF 100 MG/5ML IV SOSY
PREFILLED_SYRINGE | INTRAVENOUS | Status: DC | PRN
  Administered 2019-10-21: 100 mg via INTRAVENOUS

## 2019-10-21 MED ORDER — HYDROCODONE-ACETAMINOPHEN 5-325 MG PO TABS: 1.0000 | ORAL_TABLET | Freq: Once | ORAL | Status: AC

## 2019-10-21 MED ORDER — MIDAZOLAM HCL 2 MG/2ML IJ SOLN
INTRAMUSCULAR | Status: DC | PRN
  Administered 2019-10-21: 2 mg via INTRAVENOUS

## 2019-10-21 MED ORDER — BUPIVACAINE LIPOSOME 1.3 % IJ SUSP: 20.0000 mL | Freq: Once | INTRAMUSCULAR | Status: DC

## 2019-10-21 MED ORDER — CEFAZOLIN SODIUM-DEXTROSE 2-4 GM/100ML-% IV SOLN
INTRAVENOUS | Status: AC
  Filled 2019-10-21: qty 100

## 2019-10-21 MED ORDER — FAMOTIDINE 20 MG PO TABS
ORAL_TABLET | ORAL | Status: AC
  Administered 2019-10-21: 20 mg via ORAL
  Filled 2019-10-21: qty 1

## 2019-10-21 MED ORDER — DEXAMETHASONE SODIUM PHOSPHATE 10 MG/ML IJ SOLN
INTRAMUSCULAR | Status: DC | PRN
  Administered 2019-10-21: 10 mg via INTRAVENOUS

## 2019-10-21 MED ORDER — MIDAZOLAM HCL 2 MG/2ML IJ SOLN
INTRAMUSCULAR | Status: AC
  Filled 2019-10-21: qty 2

## 2019-10-21 MED ORDER — ONDANSETRON HCL 4 MG/2ML IJ SOLN
INTRAMUSCULAR | Status: AC
  Filled 2019-10-21: qty 2

## 2019-10-21 MED ORDER — PHENYLEPHRINE HCL (PRESSORS) 10 MG/ML IV SOLN
INTRAVENOUS | Status: AC
  Filled 2019-10-21: qty 1

## 2019-10-21 MED ORDER — DEXAMETHASONE SODIUM PHOSPHATE 10 MG/ML IJ SOLN
INTRAMUSCULAR | Status: AC
  Filled 2019-10-21: qty 1

## 2019-10-21 MED ORDER — HYDROCODONE-ACETAMINOPHEN 5-325 MG PO TABS: 1.0000 | ORAL_TABLET | Freq: Four times a day (QID) | ORAL | 0 refills | Status: DC | PRN

## 2019-10-21 MED ORDER — BUPIVACAINE HCL (PF) 0.25 % IJ SOLN
INTRAMUSCULAR | Status: DC | PRN
  Administered 2019-10-21: 8 mL

## 2019-10-21 MED ORDER — FENTANYL CITRATE (PF) 100 MCG/2ML IJ SOLN: 25.0000 ug | INTRAMUSCULAR | Status: DC | PRN

## 2019-10-21 MED ORDER — CHLORHEXIDINE GLUCONATE CLOTH 2 % EX PADS
6.0000 | MEDICATED_PAD | Freq: Once | CUTANEOUS | Status: AC
  Administered 2019-10-21: 08:00:00 6 via TOPICAL

## 2019-10-21 MED ORDER — SUCCINYLCHOLINE CHLORIDE 20 MG/ML IJ SOLN
INTRAMUSCULAR | Status: AC
  Filled 2019-10-21: qty 1

## 2019-10-21 MED ORDER — LACTATED RINGERS IV SOLN: INTRAVENOUS | Status: DC

## 2019-10-21 MED ORDER — FAMOTIDINE 20 MG PO TABS: 20.0000 mg | ORAL_TABLET | Freq: Once | ORAL | Status: AC

## 2019-10-21 MED ORDER — LIDOCAINE HCL (PF) 1 % IJ SOLN
INTRAMUSCULAR | Status: AC
  Filled 2019-10-21: qty 30

## 2019-10-21 MED ORDER — FENTANYL CITRATE (PF) 100 MCG/2ML IJ SOLN
INTRAMUSCULAR | Status: DC | PRN
  Administered 2019-10-21 (×3): 25 ug via INTRAVENOUS
  Administered 2019-10-21 (×2): 50 ug via INTRAVENOUS
  Administered 2019-10-21: 25 ug via INTRAVENOUS

## 2019-10-21 MED ORDER — FENTANYL CITRATE (PF) 100 MCG/2ML IJ SOLN
INTRAMUSCULAR | Status: AC
  Filled 2019-10-21: qty 2

## 2019-10-21 MED ORDER — HYDROCODONE-ACETAMINOPHEN 5-325 MG PO TABS
ORAL_TABLET | ORAL | Status: AC
  Administered 2019-10-21: 11:00:00 1 via ORAL
  Filled 2019-10-21: qty 1

## 2019-10-21 MED ORDER — ACETAMINOPHEN 500 MG PO TABS
ORAL_TABLET | ORAL | Status: AC
  Administered 2019-10-21: 1000 mg via ORAL
  Filled 2019-10-21: qty 2

## 2019-10-21 SURGICAL SUPPLY — 33 items
BLADE SURG 15 STRL LF DISP TIS (BLADE) ×2 IMPLANT
BLADE SURG 15 STRL SS (BLADE) ×4
CANISTER SUCT 1200ML W/VALVE (MISCELLANEOUS) ×3 IMPLANT
CHLORAPREP W/TINT 26 (MISCELLANEOUS) ×3 IMPLANT
CNTNR SPEC 2.5X3XGRAD LEK (MISCELLANEOUS) ×1
CONT SPEC 4OZ STER OR WHT (MISCELLANEOUS) ×2
CONTAINER SPEC 2.5X3XGRAD LEK (MISCELLANEOUS) ×1 IMPLANT
COVER WAND RF STERILE (DRAPES) ×3 IMPLANT
DECANTER SPIKE VIAL GLASS SM (MISCELLANEOUS) IMPLANT
DERMABOND ADVANCED (GAUZE/BANDAGES/DRESSINGS) ×2
DERMABOND ADVANCED .7 DNX12 (GAUZE/BANDAGES/DRESSINGS) ×1 IMPLANT
DEVICE DUBIN SPECIMEN MAMMOGRA (MISCELLANEOUS) IMPLANT
DRAPE LAPAROTOMY TRNSV 106X77 (MISCELLANEOUS) ×3 IMPLANT
ELECT CAUTERY BLADE TIP 2.5 (TIP)
ELECT REM PT RETURN 9FT ADLT (ELECTROSURGICAL) ×3
ELECTRODE CAUTERY BLDE TIP 2.5 (TIP) IMPLANT
ELECTRODE REM PT RTRN 9FT ADLT (ELECTROSURGICAL) ×1 IMPLANT
GLOVE ORTHO TXT STRL SZ7.5 (GLOVE) ×9 IMPLANT
GOWN STRL REUS W/ TWL LRG LVL3 (GOWN DISPOSABLE) ×2 IMPLANT
GOWN STRL REUS W/TWL LRG LVL3 (GOWN DISPOSABLE) ×4
KIT MARKER MARGIN INK (KITS) IMPLANT
KIT TURNOVER KIT A (KITS) ×3 IMPLANT
NEEDLE HYPO 22GX1.5 SAFETY (NEEDLE) ×3 IMPLANT
PACK BASIN MINOR ARMC (MISCELLANEOUS) ×3 IMPLANT
SHEARS HARMONIC 9CM CVD (BLADE) IMPLANT
SUT MNCRL 4-0 (SUTURE) ×2
SUT MNCRL 4-0 27XMFL (SUTURE) ×1
SUT VIC AB 3-0 SH 27 (SUTURE) ×2
SUT VIC AB 3-0 SH 27X BRD (SUTURE) ×1 IMPLANT
SUTURE MNCRL 4-0 27XMF (SUTURE) ×1 IMPLANT
SYR 10ML LL (SYRINGE) ×3 IMPLANT
SYR BULB IRRIG 60ML STRL (SYRINGE) ×3 IMPLANT
WATER STERILE IRR 1000ML POUR (IV SOLUTION) ×3 IMPLANT

## 2019-10-21 NOTE — Anesthesia Postprocedure Evaluation (Signed)
Anesthesia Post Note  Patient: Gabriella Lane  Procedure(s) Performed: CYST EXCISION BREAST Left (Left Breast)  Patient location during evaluation: PACU Anesthesia Type: General Level of consciousness: awake and alert and oriented Pain management: pain level controlled Vital Signs Assessment: post-procedure vital signs reviewed and stable Respiratory status: spontaneous breathing, nonlabored ventilation and respiratory function stable Cardiovascular status: blood pressure returned to baseline and stable Postop Assessment: no signs of nausea or vomiting Anesthetic complications: no     Last Vitals:  Vitals:   10/21/19 1058 10/21/19 1124  BP: 134/83 123/71  Pulse: 72 73  Resp: 16   Temp: 36.5 C   SpO2: 99% 100%    Last Pain:  Vitals:   10/21/19 1058  TempSrc: Temporal  PainSc:                  [name]

## 2019-10-21 NOTE — Op Note (Signed)
Major duct excision left breast with excision of chronic subareolar abscess/cyst.  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  Post-operative Diagnosis: same.   Surgeon: Campbell Lerner, M.D., FACS  Anesthesia: General   Findings:  As expected chronic abscess cavity with contents immediately adjacent to upper inner quadrant subareolar skin.  Estimated Blood Loss: 5 mL          Specimens: Skin with subareolar abscess cavity with major duct excision.          Complications: none              Procedure Details  The patient was seen again in the Holding Room. The benefits, complications, treatment options, and expected outcomes were discussed with the patient. The risks of bleeding, infection, recurrence of symptoms, failure to resolve symptoms, unanticipated injury, prosthetic placement, prosthetic infection, any of which could require further surgery were reviewed with the patient. The likelihood of improving the patient's symptoms with return to their baseline status is good.  The patient and/or family concurred with the proposed plan, giving informed consent.  The patient was taken to Operating Room, identified and the procedure verified.    Prior to the induction of general anesthesia, antibiotic prophylaxis was administered. VTE prophylaxis was in place.  General anesthesia was then administered and tolerated well. After the induction, the patient was positioned in the supine position and the left breast was prepped with Chloraprep and draped in the sterile fashion.   A Time Out was held and the above information confirmed.  The areolar skin changes were a little ill-defined in the upper inner quadrant, so I marked them with a marker for the incision.  I then made a wedge excision of the chronic fistulous/sinus tract that was about 930 o'clock.  Carried it sharply through the skin and then began the subareolar flap sharply.  I utilized electrocautery  for hemostasis, I thought I was beyond the area where the dermis was approximate, and utilized electrosurgery, creating a small dermal defect.  This was immediately recognized and addressed in closure, after freshening the edges of the subcentimeter defect. Once the inflammatory mass was completely dissected from the adjacent soft tissues, the major duct complex was divided as part of the inflammatory process.  Care was taken to ensure adequate skin flap thickness and avoidance of nipple dissection that would jeopardize sensation or vascularity. The lesion was then sent as specimen.  Hemostasis was confirmed with electrocautery.  Wound irrigated with sterile saline.  The dermis was reapproximated with interrupted 3-0 Vicryl's.  The epidermis was confirmed to be adequately reapproximated with interrupted 4-0 Monocryl.  Local infiltration with plain quarter percent Marcaine.  And the skin was sealed with Dermabond.  Patient tolerated procedure well.  Needle sponge instrument count reported correct.  Classification of wound is defined primarily due to the cystic lesion that is known to be a chronic abscess.  She did receive Ancef and Flagyl perioperatively.      Campbell Lerner M.D., [facility] 10/21/2019 9:59 AM

## 2019-10-21 NOTE — Discharge Instructions (Signed)
AMBULATORY SURGERY  DISCHARGE INSTRUCTIONS   1) The drugs that you were given will stay in your system until tomorrow so for the next 24 hours you should not:  A) Drive an automobile B) Make any legal decisions C) Drink any alcoholic beverage   2) You may resume regular meals tomorrow.  Today it is better to start with liquids and gradually work up to solid foods.  You may eat anything you prefer, but it is better to start with liquids, then soup and crackers, and gradually work up to solid foods.   3) Please notify your doctor immediately if you have any unusual bleeding, trouble breathing, redness and pain at the surgery site, drainage, fever, or pain not relieved by medication.    4) Additional Instructions:        Please contact your physician with any problems or Same Day Surgery at [phone], Monday through Friday 6 am to 4 pm, or [place] at [facility] number at [phone].AMBULATORY SURGERY  DISCHARGE INSTRUCTIONS   5) The drugs that you were given will stay in your system until tomorrow so for the next 24 hours you should not:  D) Drive an automobile E) Make any legal decisions F) Drink any alcoholic beverage   6) You may resume regular meals tomorrow.  Today it is better to start with liquids and gradually work up to solid foods.  You may eat anything you prefer, but it is better to start with liquids, then soup and crackers, and gradually work up to solid foods.   7) Please notify your doctor immediately if you have any unusual bleeding, trouble breathing, redness and pain at the surgery site, drainage, fever, or pain not relieved by medication. 8)   9) Your post-operative visit with Dr.                                     is: Date:                        Time:    Please call to schedule your post-operative visit.  10) Additional Instructions:

## 2019-10-21 NOTE — Interval H&P Note (Signed)
History and Physical Interval Note:  10/21/2019 9:00 AM  Gabriella Lane  has presented today for surgery, with the diagnosis of Left breast abcess chronic.  The various methods of treatment have been discussed with the patient and family. After consideration of risks, benefits and other options for treatment, the patient has consented to  Procedure(s): CYST EXCISION BREAST Left (Left) as a surgical intervention.  The patient's history has been reviewed, patient examined, no change in status, stable for surgery.  I have reviewed the patient's chart and labs.  Questions were answered to the patient's satisfaction.   Her left shoulder is marked at the correct side.   Campbell Lerner

## 2019-10-21 NOTE — Anesthesia Preprocedure Evaluation (Signed)
Anesthesia Evaluation  Patient identified by MRN, date of birth, ID band Patient awake    Reviewed: Allergy & Precautions, NPO status , Patient's Chart, lab work & pertinent test results  Airway Mallampati: III       Dental   Pulmonary Current Smoker,           Cardiovascular negative cardio ROS       Neuro/Psych negative neurological ROS  negative psych ROS   GI/Hepatic negative GI ROS,   Endo/Other    Renal/GU   negative genitourinary   Musculoskeletal   Abdominal   Peds negative pediatric ROS (+)  Hematology   Anesthesia Other Findings   Reproductive/Obstetrics                             Anesthesia Physical Anesthesia Plan  ASA: II  Anesthesia Plan: General   Post-op Pain Management:    Induction: Intravenous  PONV Risk Score and Plan:   Airway Management Planned: LMA  Additional Equipment:   Intra-op Plan:   Post-operative Plan: Extubation in OR  Informed Consent: I have reviewed the patients History and Physical, chart, labs and discussed the procedure including the risks, benefits and alternatives for the proposed anesthesia with the patient or authorized representative who has indicated his/her understanding and acceptance.     Dental advisory given  Plan Discussed with: CRNA and Surgeon  Anesthesia Plan Comments:         Anesthesia Quick Evaluation

## 2019-10-21 NOTE — Anesthesia Procedure Notes (Signed)
Procedure Name: LMA Insertion Performed by: Shanik Brookshire, CRNA Pre-anesthesia Checklist: Patient identified, Patient being monitored, Timeout performed, Emergency Drugs available and Suction available Patient Re-evaluated:Patient Re-evaluated prior to induction Oxygen Delivery Method: Circle system utilized Preoxygenation: Pre-oxygenation with 100% oxygen Induction Type: IV induction Ventilation: Mask ventilation without difficulty LMA: LMA inserted LMA Size: 3.5 Tube type: Oral Number of attempts: 1 Placement Confirmation: positive ETCO2 and breath sounds checked- equal and bilateral Tube secured with: Tape Dental Injury: Teeth and Oropharynx as per pre-operative assessment        

## 2019-10-21 NOTE — Transfer of Care (Signed)
Immediate Anesthesia Transfer of Care Note  Patient: Gabriella Lane  Procedure(s) Performed: CYST EXCISION BREAST Left (Left Breast)  Patient Location: PACU  Anesthesia Type:General  Level of Consciousness: drowsy  Airway & Oxygen Therapy: Patient Spontanous Breathing and Patient connected to face mask oxygen  Post-op Assessment: Report given to RN and Post -op Vital signs reviewed and stable  Post vital signs: Reviewed and stable  Last Vitals:  Vitals Value Taken Time  BP 102/85 10/21/19 1003  Temp    Pulse 64 10/21/19 1003  Resp 16 10/21/19 1003  SpO2 98 % 10/21/19 1003  Vitals shown include unvalidated device data.  Last Pain:  Vitals:   10/21/19 0723  TempSrc: Temporal         Complications: No apparent anesthesia complications

## 2019-10-22 LAB — SURGICAL PATHOLOGY

## 2019-10-29 ENCOUNTER — Other Ambulatory Visit: Payer: Self-pay

## 2019-10-29 ENCOUNTER — Encounter: Payer: Self-pay | Admitting: Surgery

## 2019-10-29 ENCOUNTER — Ambulatory Visit (INDEPENDENT_AMBULATORY_CARE_PROVIDER_SITE_OTHER): Payer: Self-pay | Admitting: Surgery

## 2019-10-29 VITALS — BP 128/87 | HR 82 | Temp 97.7°F | Resp 14 | Ht 63.0 in | Wt 213.2 lb

## 2019-10-29 DIAGNOSIS — N611 Abscess of the breast and nipple: Secondary | ICD-10-CM

## 2019-10-29 NOTE — Patient Instructions (Signed)
You can continue to use a gauze for drainage as needed. You may use a sports bra for support. You may return back to work.  You will follow up in 2 weeks. See your follow up appointment below.   Please call the office if you have any questions or concerns.

## 2019-10-29 NOTE — Progress Notes (Signed)
[facility] POST-OP OFFICE VISIT  10/29/2019  HPI: Gabriella Lane is a 39 y.o. female 8 days s/p excision of left breast chronic subareolar abscess.  She had some bloody drainage via her incision.  She denies any significant swelling, tenderness or redness.  She has no fevers or chills.  She states she feels a knot in the region.  And reports her pain being a 5 out of 10.  Vital signs: BP 128/87   Pulse 82   Temp 97.7 F (36.5 C)   Resp 14   Ht 5\' 3"  (1.6 m)   Wt 213 lb 3.2 oz (96.7 kg)   LMP 10/01/2019 (Approximate)   SpO2 99%   BMI 37.77 kg/m    Physical Exam: Constitutional: She looks well, and appears to be comfortable.  Skin: Incisions on left breast circumareolar, appear to be healing nicely.  There is some blood tinged Dermabond inferiorly but without active drainage at present.  There is no open wound.  There is no underlying appreciable seroma, mass or hematoma.  There is mild ecchymotic changes on the medial aspect of her skin.  There is no evidence of inflammatory process.  And I appreciate no mass whatsoever.  Assessment/Plan: This is a 39 y.o. female 8 days s/p left breast surgery as above.  Patient Active Problem List   Diagnosis Date Noted  . Abscess of breast, left 08/04/2019  . Cervical dysplasia 10/01/2018  . Breast pain 11/09/2015    -Encouragement given, she may continue to shower as desired.  Keep area clean and dry.  Expectant that the minimal bloody drainage is diminishing or has already stopped.  We will have her back in 2 weeks or as needed.   11/11/2015 M.D., FACS 10/29/2019, 9:42 AM

## 2019-11-04 ENCOUNTER — Encounter: Payer: Self-pay | Admitting: Surgery

## 2019-11-04 NOTE — Telephone Encounter (Signed)
We discussed the degree of leakage, and the lack of tenderness, and encouraged her to cleanse the remaining Dermabond with longer showering and keeping the area clean with H2O2, and dry absorptive dressings.  Will see her next week.

## 2019-11-12 ENCOUNTER — Ambulatory Visit (INDEPENDENT_AMBULATORY_CARE_PROVIDER_SITE_OTHER): Payer: Self-pay | Admitting: Surgery

## 2019-11-12 ENCOUNTER — Other Ambulatory Visit: Payer: Self-pay

## 2019-11-12 ENCOUNTER — Encounter: Payer: Self-pay | Admitting: Surgery

## 2019-11-12 VITALS — BP 106/77 | HR 84 | Temp 98.6°F | Resp 12 | Ht 63.0 in | Wt 211.4 lb

## 2019-11-12 DIAGNOSIS — N611 Abscess of the breast and nipple: Secondary | ICD-10-CM

## 2019-11-12 MED ORDER — DOXYCYCLINE HYCLATE 100 MG PO CAPS: 100.0000 mg | ORAL_CAPSULE | Freq: Two times a day (BID) | ORAL | 0 refills | Status: AC

## 2019-11-12 NOTE — Progress Notes (Signed)
[facility] POST-OP OFFICE VISIT  11/12/2019  HPI: Gabriella Lane is a 39 y.o. female 22 days s/p excision of subareolar chronic abscess cavity and area of induration.  She reports today she continues to have drainage and residual density in the area of her surgery.  She denies fevers and chills.  Vital signs: BP 106/77   Pulse 84   Temp 98.6 F (37 C)   Resp 12   Ht 5\' 3"  (1.6 m)   Wt 211 lb 6.4 oz (95.9 kg)   SpO2 97%   BMI 37.45 kg/m    Physical Exam: Constitutional: She appears well On the left breast in the upper inner quadrant, there is a small unhealed punctum which drains of serous fluid.  There is more density present than there was on her last exam.   Assessment/Plan: This is a 39 y.o. female 22 days s/p left breast surgery.  Patient Active Problem List   Diagnosis Date Noted  . Abscess of breast, left 08/04/2019  . Cervical dysplasia 10/01/2018  . Breast pain 11/09/2015    -We will plan on a 4-week course of doxycycline, have her follow-up in 1 month.  She was anticipating this being a lingering a recurring issue and were hoping her antibiotics will help complete the healing more trying to achieve.   11/11/2015 M.D., FACS 11/12/2019, 1:44 PM

## 2019-11-12 NOTE — Patient Instructions (Signed)
Continue with the gauze changes twice a day just as needed. You may start using warm/hot compresses.   Pick up your prescription at your local pharmacy.  We will see you in a month, sooner if it gets worst. Please see your appointments below.  Please call the office if you have any questions or concerns.

## 2019-12-10 ENCOUNTER — Encounter: Payer: Self-pay | Admitting: Surgery

## 2019-12-24 ENCOUNTER — Encounter: Payer: Self-pay | Admitting: Surgery

## 2019-12-24 ENCOUNTER — Ambulatory Visit (INDEPENDENT_AMBULATORY_CARE_PROVIDER_SITE_OTHER): Payer: Self-pay | Admitting: Surgery

## 2019-12-24 ENCOUNTER — Other Ambulatory Visit: Payer: Self-pay

## 2019-12-24 VITALS — BP 134/84 | HR 79 | Temp 99.0°F | Resp 12 | Wt 207.8 lb

## 2019-12-24 DIAGNOSIS — N611 Abscess of the breast and nipple: Secondary | ICD-10-CM

## 2019-12-24 NOTE — Patient Instructions (Addendum)
Your Left breast ultrasound is scheduled for Monday January 04, 2020 at 9:20am at the [facility].   See your follow up appointment below. Call the office if you have any questions or concerns.

## 2019-12-24 NOTE — Progress Notes (Signed)
Surgical Clinic Progress/Follow-up Note   HPI:  39 y.o. Female presents to clinic for 1 month follow-up of her recurrent subareolar abscess.  Patient reports some improvement/resolution of prior issues she denies drainage from her prior wound/sinus tract, noting it has completely healed.  However she does sense a buildup daily, which she manipulates and drains from her nipple.  She denies fevers and chills.  She tolerated the month of doxycycline, with only a yeast infection to time to discuss.  Review of Systems:  Constitutional: denies fever/chills  ENT: denies sore throat, hearing problems  Respiratory: denies shortness of breath, wheezing  Cardiovascular: denies chest pain, palpitations  Gastrointestinal: denies abdominal pain, N/V, or diarrhea/and bowel function as per interval history Skin: Denies any other rashes or skin discolorations as per interval history  Vital Signs:  BP 134/84   Pulse 79   Temp 99 F (37.2 C)   Resp 12   Wt 207 lb 12.8 oz (94.3 kg)   SpO2 97%   BMI 36.81 kg/m    Physical Exam:  Constitutional:  -- Normal/Obese body habitus  -- Awake, alert, and oriented x3   GU  --Limited left breast exam confirms density consistent with scarring in the immediate nipple areolar complex.  I am unable to express any discharge or appreciate any fluctuance consistent with a cystic lesion.  There is no erythema or induration present. Musculoskeletal / Integumentary:  -- Wounds or skin discoloration: None appreciated except well-healed post-surgical incision-- Extremities: B/L UE and LE FROM, hands and feet warm, no edema   Laboratory studies: None.  Imaging:No new pertinent imaging available for review   Assessment:  39 y.o. yo Female with a problem list including...  Patient Active Problem List   Diagnosis Date Noted  . Abscess of breast, left 08/04/2019  . Cervical dysplasia 10/01/2018  . Breast pain 11/09/2015    presents to clinic for follow-up evaluation of  chronic left breast abscess, progressing well.  Plan:              - return to clinic after repeat left breast ultrasound per patient's request as needed, instructed to call office if any questions or concerns  All of the above recommendations were discussed with the patient and patient's family, and all of patient's and family's questions were answered to his/her/their expressed satisfaction.  Campbell Lerner, MD, FACS [place]: [facility] General Surgery - Partnering for exceptional care. Office: [phone]

## 2020-01-04 ENCOUNTER — Ambulatory Visit
Admission: RE | Admit: 2020-01-04 | Discharge: 2020-01-04 | Disposition: A | Discharge status: Home / Self Care | Payer: Self-pay | Source: Ambulatory Visit | Attending: Surgery | Admitting: Surgery

## 2020-01-04 DIAGNOSIS — N611 Abscess of the breast and nipple: Secondary | ICD-10-CM | POA: Insufficient documentation

## 2020-01-07 ENCOUNTER — Other Ambulatory Visit: Payer: Self-pay

## 2020-01-07 ENCOUNTER — Encounter: Payer: Self-pay | Admitting: Surgery

## 2020-01-07 ENCOUNTER — Ambulatory Visit (INDEPENDENT_AMBULATORY_CARE_PROVIDER_SITE_OTHER): Payer: Self-pay | Admitting: Surgery

## 2020-01-07 VITALS — BP 111/71 | HR 63 | Temp 98.4°F | Ht 63.0 in | Wt 206.0 lb

## 2020-01-07 DIAGNOSIS — N611 Abscess of the breast and nipple: Secondary | ICD-10-CM

## 2020-01-07 NOTE — Patient Instructions (Addendum)
Follow up ultrasound in 3 months. We will call you with the results.   Continue self breast exams. Call office for any new breast issues or concerns.

## 2020-01-07 NOTE — Progress Notes (Signed)
Surgical Clinic Progress/Follow-up Note   HPI:  39 y.o. Female presents to clinic for left breast subareolar cyst follow-up  Patient reports  improvement/resolution of prior issues, fever/chills.  Review of Systems:  Constitutional: denies fever/chills  ENT: denies sore throat, hearing problems  Respiratory: denies shortness of breath, wheezing  Cardiovascular: denies chest pain, palpitations  Gastrointestinal: denies abdominal pain, N/V, Skin: Denies any other rashes or skin discolorations   Vital Signs:  BP 111/71   Pulse 63   Temp 98.4 F (36.9 C)   Ht 5\' 3"  (1.6 m)   Wt 206 lb (93.4 kg)   LMP 01/03/2020 (Exact Date)   SpO2 98%   BMI 36.49 kg/m    Physical Exam:  Constitutional:  -- Normal body habitus,  -- Awake, alert, and oriented x3     GU  --thickened areola of the upper inner quadrant on the left breast, 2 punctate areas of drainage reported, essentially healed.  No observable nipple discharge today.  No fluctuance, no induration. Musculoskeletal / Integumentary:  -- Wounds or skin discoloration: None appreciated except post-surgical incisions-- Extremities: B/L UE and LE FROM, hands and feet warm, no edema    Imaging:  CLINICAL DATA:  39 year old female presenting for follow-up infection in the left breast in enlarged left axillary lymph nodes. She had surgery to clear the infection about 1 month ago, however she has daily drainage from the incision site.  EXAM: ULTRASOUND OF THE LEFT BREAST  COMPARISON:  Previous exam(s).  FINDINGS: On physical exam, there is a scar adjacent to the edge of the areola in the upper inner left breast. No active drainage is seen during my exam.  Targeted ultrasound is performed, showing a hypoechoic tissue deep to the surgical site in the upper inner periareolar left breast spanning 3.6 cm in the anti radial dimension and measures up to 1 cm deep. No clear fluid pocket is seen within this hypoechoic tissue. No deeper  abscess is seen in this region. Ultrasound of the left axilla demonstrates 2 persistent lymph nodes with thickened cortices.  IMPRESSION: 1. There is a hypoechoic area of tissue deep to the incision site in the upper inner periareolar left breast, however no definite fluid pocket is seen within this tissue. No deeper abscess is identified.  2.  There are 2 lymph nodes with mildly thickened cortices.  RECOMMENDATION: 1. Continued clinical follow-up for is the infection/surgical site drainage.  2. Three-month follow-up left axillary ultrasound to monitor the left axillary thickened lymph nodes.  I have discussed the findings and recommendations with the patient. If applicable, a reminder letter will be sent to the patient regarding the next appointment.  BI-RADS CATEGORY  2: Benign.   Electronically Signed   By: Ammie Ferrier M.D.   On: 01/04/2020 09:47   Assessment:  39 y.o. yo Female with a problem list including...  Patient Active Problem List   Diagnosis Date Noted  . Abscess of breast, left 08/04/2019  . Cervical dysplasia 10/01/2018  . Breast pain 11/09/2015    presents to clinic for follow-up evaluation of left areolar cyst/abscess, progressing well.  Plan:              - return to clinic in 3 months or as needed, instructed to call office if any questions or concerns Plan repeat left breast ultrasound in 3 months. We have agreed that certain surgical options at this time carries more risk than gain. All of the above recommendations were discussed with the patient and  patient's family, and all of patient's and family's questions were answered to his/her/their expressed satisfaction.  Campbell Lerner, MD, FACS : [facility] General Surgery - Partnering for exceptional care. Office: [phone]

## 2020-02-18 ENCOUNTER — Other Ambulatory Visit: Payer: Self-pay

## 2020-02-18 DIAGNOSIS — R590 Localized enlarged lymph nodes: Secondary | ICD-10-CM

## 2020-04-11 ENCOUNTER — Ambulatory Visit
Admission: RE | Admit: 2020-04-11 | Discharge: 2020-04-11 | Disposition: A | Discharge status: Home / Self Care | Payer: Medicaid Other | Source: Ambulatory Visit | Attending: Surgery | Admitting: Surgery

## 2020-04-11 DIAGNOSIS — R590 Localized enlarged lymph nodes: Secondary | ICD-10-CM | POA: Insufficient documentation

## 2020-12-16 IMAGING — MG DIGITAL DIAGNOSTIC BILAT W/ TOMO W/ CAD
8 of 15 series · 8 of 40 positions shown · non-contrast
Comparison: Previous exam(s).

CLINICAL DATA: 38-year-old female presenting for follow-up for a
left breast abscess.The patient finished her most recent course of
antibiotics 2 days ago.

EXAM:
DIGITAL DIAGNOSTIC BILATERAL MAMMOGRAM WITH CAD AND TOMO
ULTRASOUND LEFT BREAST

[L CC synth-2D]
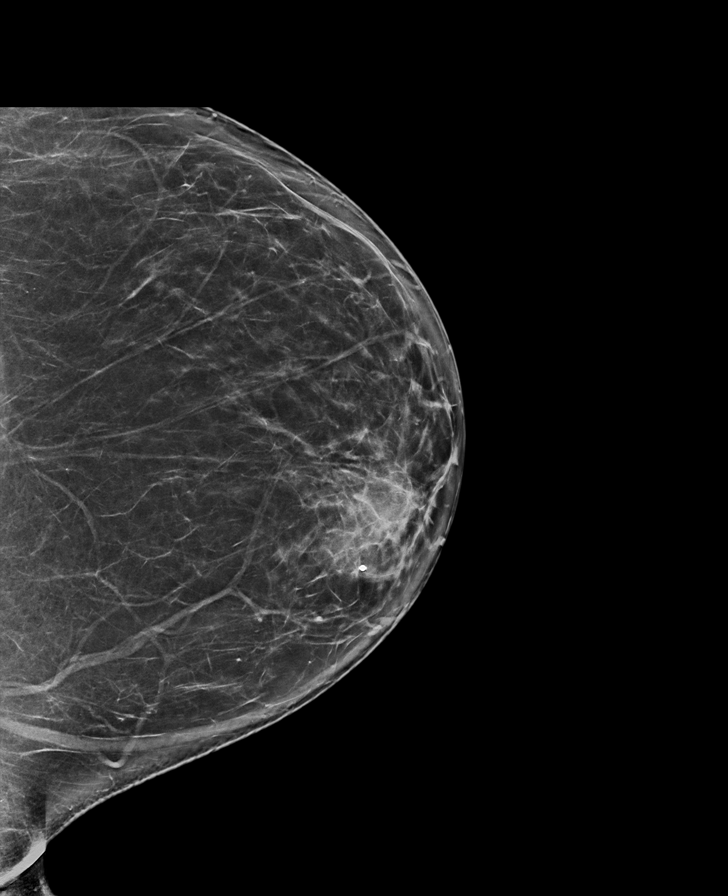

[R CC synth-2D (1 of 2)]
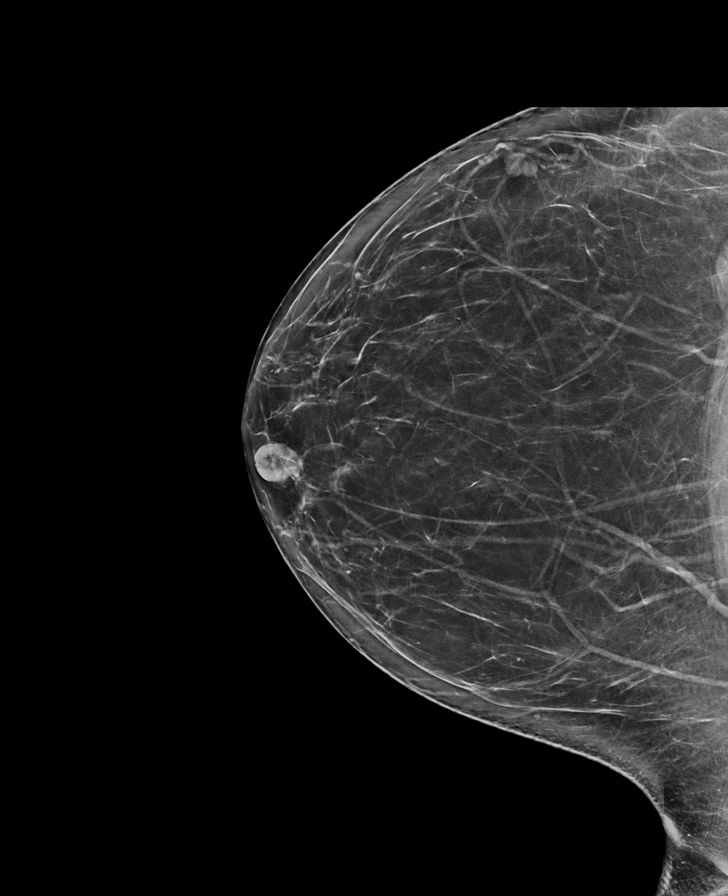

[L TAN synth-2D]
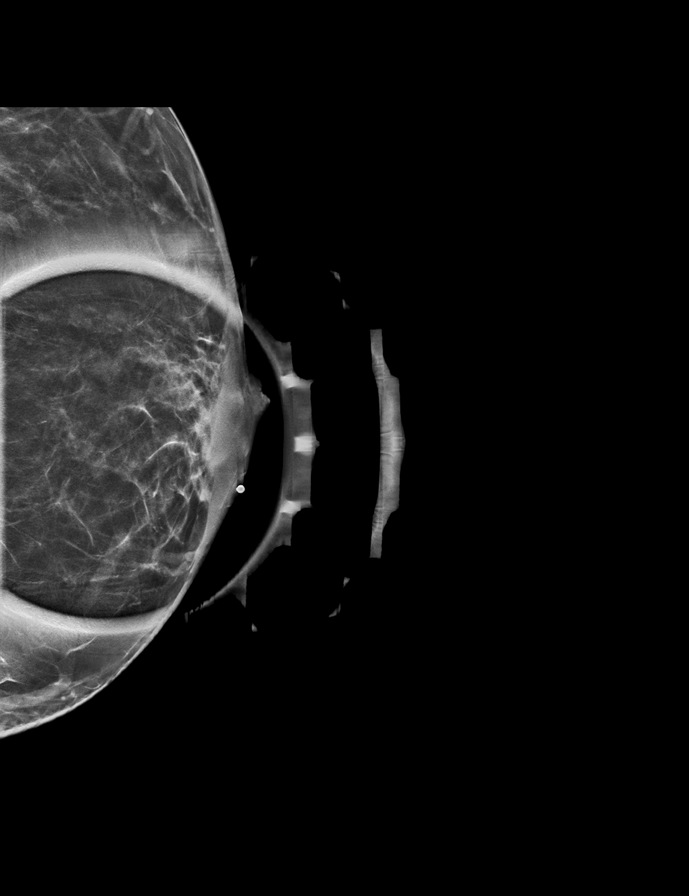

[R MLO synth-2D]
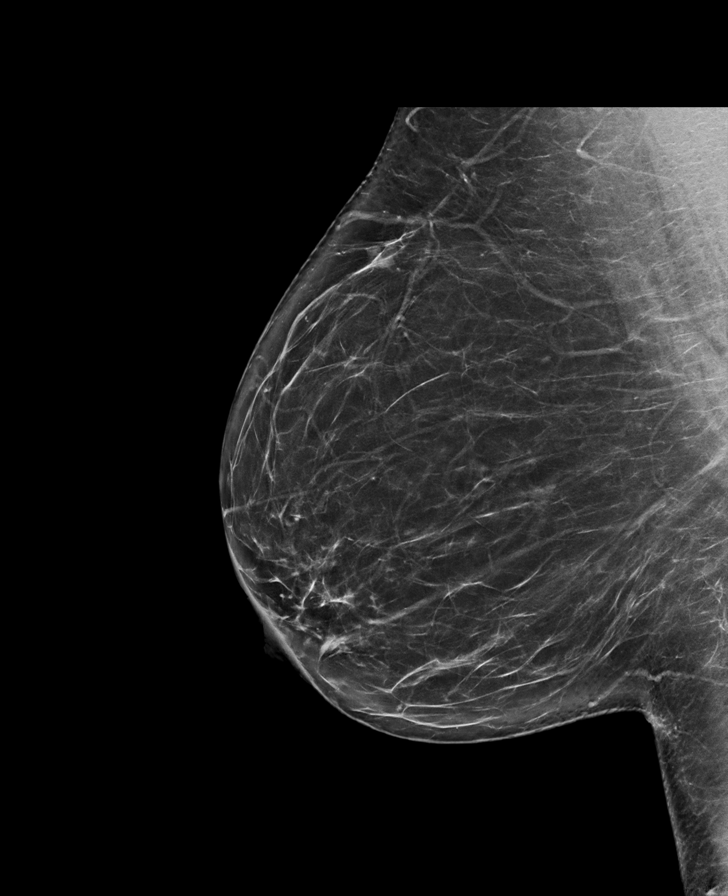

[L MLO synth-2D (1 of 2)]
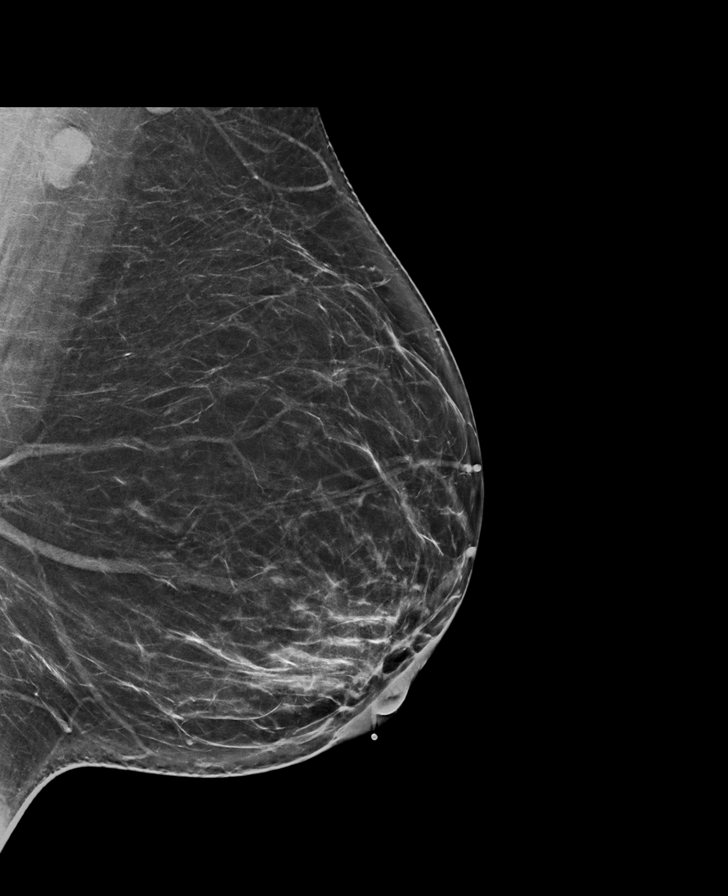

[R CC synth-2D (2 of 2)]
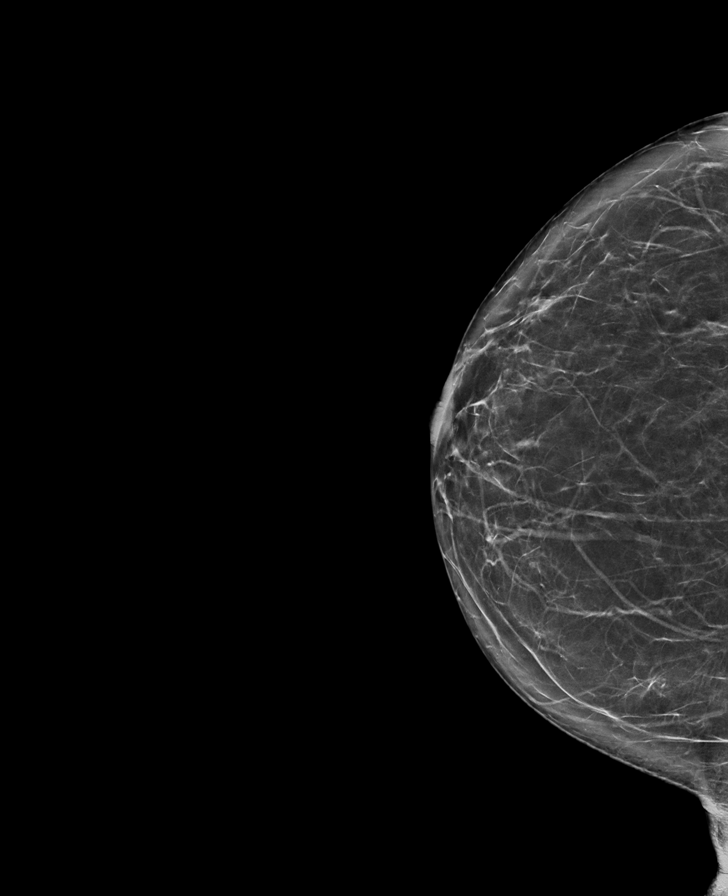

[L MLO synth-2D (2 of 2)]
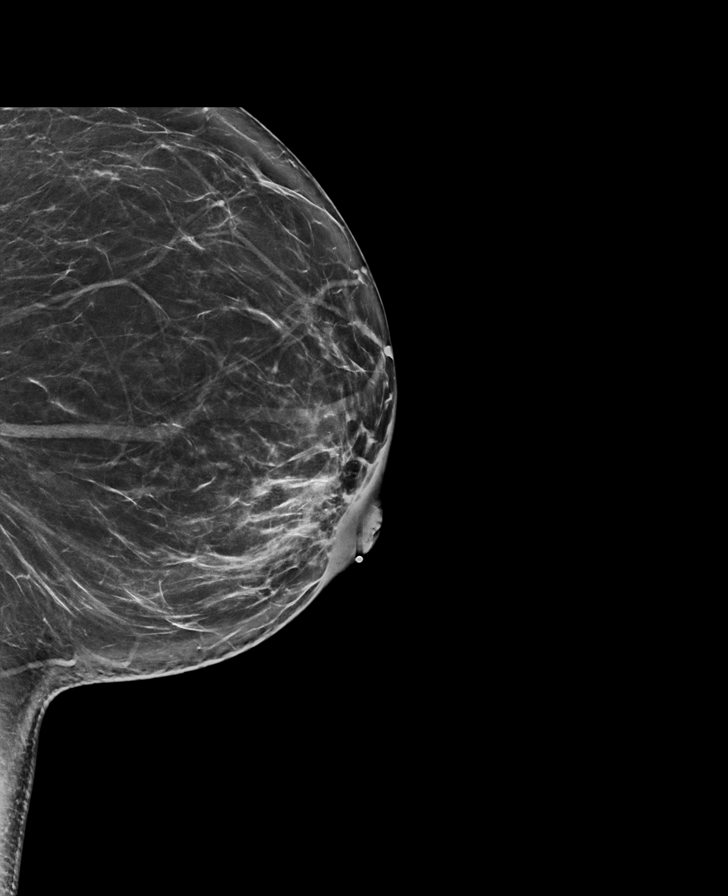

[L TAN tomo · tomo slice 36/53.0]
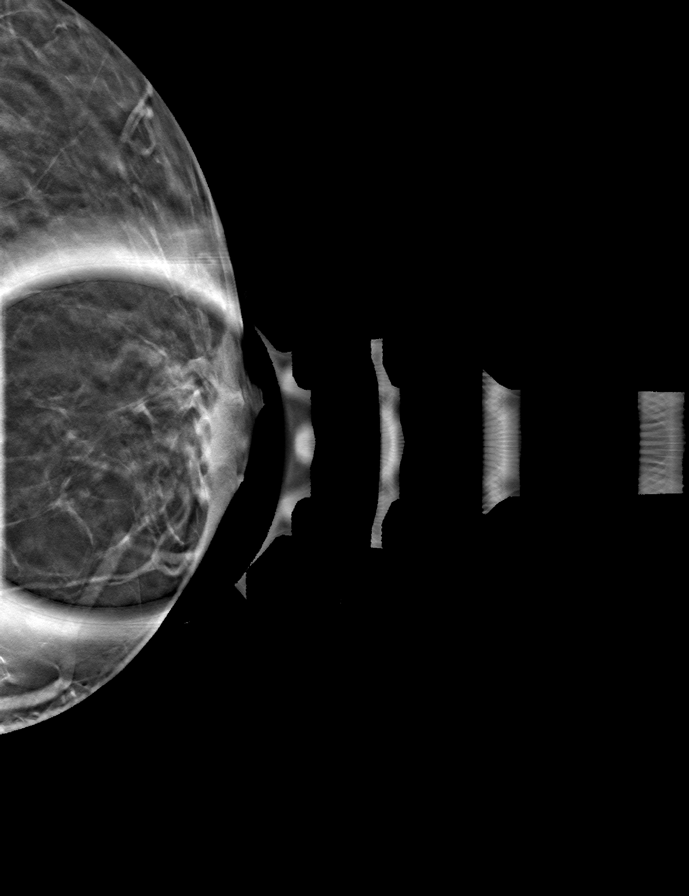

[8 of 40 positions shown; findings below may reference images not displayed]

ACR Breast Density Category b: There are scattered areas of
fibroglandular density.
FINDINGS: In the periareolar left breast, there is focal skin thickening and a
focal asymmetry in the retroareolar breast tissues. A few enlarged
lymph nodes are seen in the left axilla, likely reactive. No
suspicious calcifications, masses or areas of distortion are seen in
the bilateral breasts.

Mammographic images were processed with CAD.

On physical exam, there is a palpable lump in the upper inner
periareolar left breast. There is an indentation in the skin at the
palpable site where the patient states that the abscess continues to
drain externally. There is no active drainage seen today.

Targeted ultrasound is performed, showing a complex fluid collection
just inferior to the skin. The fluid collection along with the
surrounding hypoechoic tissue measures approximately 3.8 x 1.1 x
cm, previously measured as 2.6 x 1.1 x 2.5 cm. There are several
prominent thickened lymph nodes seen in the left axilla.
IMPRESSION: 1. There is a persistent abscess in the retroareolar left breast at
11 o'clock.

2. There are several abnormal lymph nodes in left axilla, likely
reactive.

RECOMMENDATION:
1. Ultrasound-guided aspiration is recommended for the left breast
abscess.

2. I prescribed a 14 day course of doxycycline, and advise the
patient to start the antibiotics right away. She has a follow-up
with Dr. Zadi in about a week.

3. One week follow-up left breast ultrasound and possible aspiration
recommended.

4. Three-month follow-up left axillary ultrasound to ensure that the
presumed reactive lymph nodes return to normal.

I have discussed the findings and recommendations with the patient.
If applicable, a reminder letter will be sent to the patient
regarding the next appointment.

BI-RADS CATEGORY  3: Probably benign.

## 2021-08-28 ENCOUNTER — Emergency Department
Admission: EM | Admit: 2021-08-28 | Discharge: 2021-08-28 | Disposition: A | Discharge status: Home / Self Care | Payer: Medicaid Other | Attending: Emergency Medicine | Admitting: Emergency Medicine

## 2021-08-28 ENCOUNTER — Other Ambulatory Visit: Payer: Self-pay

## 2021-08-28 DIAGNOSIS — Z28310 Unvaccinated for covid-19: Secondary | ICD-10-CM | POA: Insufficient documentation

## 2021-08-28 DIAGNOSIS — F17210 Nicotine dependence, cigarettes, uncomplicated: Secondary | ICD-10-CM | POA: Insufficient documentation

## 2021-08-28 DIAGNOSIS — U071 COVID-19: Secondary | ICD-10-CM | POA: Insufficient documentation

## 2021-08-28 LAB — RESP PANEL BY RT-PCR (FLU A&B, COVID) ARPGX2
Influenza A by PCR: NEGATIVE
Influenza B by PCR: NEGATIVE
SARS Coronavirus 2 by RT PCR: POSITIVE — AB

## 2021-08-28 NOTE — ED Triage Notes (Signed)
Pt c/o sore throat with bland taste, cough, fever and body aches since satruyday

## 2021-08-28 NOTE — ED Notes (Signed)
Pt presents to the ED for fever, cough, sore throat, body aches, and loss of taste since Saturday. States that her daughter has been around people who have been sick. Pt also states that she has white spot on the back of her throat. Pt is A&Ox4 and NAD

## 2021-08-28 NOTE — ED Provider Notes (Signed)
[facility] Emergency Department Provider Note  ____________________________________________   Event Date/Time   First MD Initiated Contact with Patient 08/28/21 [id number]     (approximate)  I have reviewed the triage vital signs and the nursing notes.   HISTORY  Chief Complaint URI   HPI Gabriella Lane is a 40 y.o. female presents to the ED with complaint of sore throat, cough, fever, body aches and a "bland taste".  Patient states that symptoms started 2 and half days ago.  She is also here with another family member with same symptoms.         Past Medical History:  Diagnosis Date   Hemorrhoid    Mastitis chronic     Patient Active Problem List   Diagnosis Date Noted   Abscess of breast, left 08/04/2019   Cervical dysplasia 10/01/2018   Breast pain 11/09/2015    Past Surgical History:  Procedure Laterality Date   BREAST BIOPSY Left 2013   benign   BREAST CYST ASPIRATION Left 07/29/2019   ABUNDANT WBC PRESENT, PREDOMINANTLY PMN    BREAST CYST EXCISION Left 10/21/2019   Procedure: CYST EXCISION BREAST Left;  Surgeon: Campbell Lerner, MD;  Location: [facility] ORS;  Service: General;  Laterality: Left;   TUBAL LIGATION      Prior to Admission medications   Medication Sig Start Date End Date Taking? Authorizing Provider  acetaminophen (TYLENOL) 500 MG tablet Take 1,000 mg by mouth every 6 (six) hours as needed for moderate pain or headache.    [provider]  Aspirin-Caffeine (BC FAST PAIN RELIEF PO) Take 1 packet by mouth daily as needed (pain).    [provider]  oxymetazoline (AFRIN) 0.05 % nasal spray Place 1 spray into both nostrils 2 (two) times daily as needed for congestion.    [provider]    Allergies Patient has no known allergies.  Family History  Problem Relation Age of Onset   Hypertension Mother    COPD Mother    Breast cancer Neg Hx     Social History Social History   Tobacco Use   Smoking  status: Every Day    Packs/day: 1.00    Types: Cigarettes   Smokeless tobacco: Never  Vaping Use   Vaping Use: Never used  Substance Use Topics   Alcohol use: No   Drug use: Yes    Types: Marijuana    Review of Systems Constitutional: Positive fever/chills Eyes: No visual changes. ENT: Positive sore throat. Cardiovascular: Denies chest pain. Respiratory: Denies shortness of breath.  Positive for cough. Gastrointestinal: No abdominal pain.  No nausea, no vomiting.  No diarrhea.  Genitourinary: Negative for dysuria. Musculoskeletal: Positive for body aches. Skin: Negative for rash. Neurological: Negative for headaches, focal weakness or numbness.   ____________________________________________   PHYSICAL EXAM:  VITAL SIGNS: ED Triage Vitals [08/28/21 0720]  Enc Vitals Group     BP 117/89     Pulse Rate 89     Resp 17     Temp 98.6 F (37 C)     Temp Source Oral     SpO2 95 %     Weight 215 lb (97.5 kg)     Height 5\' 3"  (1.6 m)     Head Circumference      Peak Flow      Pain Score 4     Pain Loc      Pain Edu?      Excl. in GC?  Constitutional: Alert and oriented. Well appearing and in no acute distress. Eyes: Conjunctivae are normal. PERRL. EOMI. Head: Atraumatic. Nose: Mild congestion/rhinnorhea. Mouth/Throat: Mucous membranes are moist.  Oropharynx non-erythematous.  No exudate. Neck: No stridor.   Hematological/Lymphatic/Immunilogical: No cervical lymphadenopathy. Cardiovascular: Normal rate, regular rhythm. Grossly normal heart sounds.  Good peripheral circulation. Respiratory: Normal respiratory effort.  No retractions. Lungs CTAB. Gastrointestinal: Soft and nontender. No distention.  Musculoskeletal: Moves upper and lower extremities faint difficulty.  Normal gait was noted. Neurologic:  Normal speech and language. No gross focal neurologic deficits are appreciated. No gait instability. Skin:  Skin is warm, dry and intact. No rash  noted. Psychiatric: Mood and affect are normal. Speech and behavior are normal.  ____________________________________________   LABS (all labs ordered are listed, but only abnormal results are displayed)  Labs Reviewed  RESP PANEL BY RT-PCR (FLU A&B, COVID) ARPGX2 - Abnormal; Notable for the following components:      Result Value   SARS Coronavirus 2 by RT PCR POSITIVE (*)    All other components within normal limits    PROCEDURES  Procedure(s) performed (including Critical Care):  Procedures   ____________________________________________   INITIAL IMPRESSION / ASSESSMENT AND PLAN / ED COURSE  As part of my medical decision making, I reviewed the following data within the electronic MEDICAL RECORD NUMBER Notes from prior ED visits  40 year old female presents to the ED with daughter with symptoms of fever, body aches and sore throat for 2 days.  Patient is nonvaccinated.  Physical exam was benign however patient was made aware that her COVID test was positive.  She is aware that she will need to quarantine for 10 days from the onset of symptoms since she has not been vaccinated.  She is to continue with Tylenol, ibuprofen and fluids to stay hydrated.  She is to follow-up with her PCP if any continued problems.  She is aware that she should return to the emergency department if any severe worsening of her symptoms such as difficulty breathing or shortness of breath.   ____________________________________________   FINAL CLINICAL IMPRESSION(S) / ED DIAGNOSES  Final diagnoses:  COVID-19     ED Discharge Orders     None        Note:  This document was prepared using Dragon voice recognition software and may include unintentional dictation errors.    Tommi Rumps, PA-C 08/28/21 1104    Delton Prairie, MD 08/28/21 [phone]

## 2021-08-28 NOTE — Discharge Instructions (Signed)
Follow-up with your primary care provider if any continued problems or concerns.  Tylenol, ibuprofen or Advil will help with fever, body aches, throat pain and headache.  Drink lots of fluids to stay hydrated.  Because you have not been vaccinated you should remain quarantine for 10 days since the onset of your symptoms.  Return to the emergency department if any severe worsening of your symptoms such as shortness of breath or difficulty breathing.
